# Patient Record
Sex: Male | Born: 1980
Health system: Southern US, Community
[De-identification: ages and names within clinical notes are randomized; demographics above are authoritative.]

## PROBLEM LIST (undated history)

## (undated) DIAGNOSIS — I10 Essential (primary) hypertension: Secondary | ICD-10-CM

## (undated) DIAGNOSIS — F502 Bulimia nervosa, unspecified: Secondary | ICD-10-CM

## (undated) DIAGNOSIS — H0019 Chalazion unspecified eye, unspecified eyelid: Secondary | ICD-10-CM

## (undated) DIAGNOSIS — T7840XA Allergy, unspecified, initial encounter: Secondary | ICD-10-CM

## (undated) DIAGNOSIS — G459 Transient cerebral ischemic attack, unspecified: Secondary | ICD-10-CM

## (undated) DIAGNOSIS — E7800 Pure hypercholesterolemia, unspecified: Secondary | ICD-10-CM

## (undated) DIAGNOSIS — A539 Syphilis, unspecified: Secondary | ICD-10-CM

## (undated) DIAGNOSIS — F410 Panic disorder [episodic paroxysmal anxiety] without agoraphobia: Secondary | ICD-10-CM

## (undated) DIAGNOSIS — F329 Major depressive disorder, single episode, unspecified: Secondary | ICD-10-CM

## (undated) DIAGNOSIS — I639 Cerebral infarction, unspecified: Secondary | ICD-10-CM

## (undated) DIAGNOSIS — K120 Recurrent oral aphthae: Secondary | ICD-10-CM

## (undated) HISTORY — DX: Pure hypercholesterolemia, unspecified: E78.00

## (undated) HISTORY — PX: TONSILLECTOMY: SHX5217

## (undated) HISTORY — DX: Major depressive disorder, single episode, unspecified: F32.9

## (undated) HISTORY — DX: Cerebral infarction, unspecified: I63.9

## (undated) HISTORY — DX: Recurrent oral aphthae: K12.0

## (undated) HISTORY — DX: Syphilis, unspecified: A53.9

## (undated) HISTORY — DX: Chalazion unspecified eye, unspecified eyelid: H00.19

## (undated) HISTORY — DX: Panic disorder (episodic paroxysmal anxiety): F41.0

## (undated) HISTORY — DX: Bulimia nervosa, unspecified: F50.20

## (undated) HISTORY — DX: Transient cerebral ischemic attack, unspecified: G45.9

## (undated) HISTORY — DX: Allergy, unspecified, initial encounter: T78.40XA

## (undated) HISTORY — DX: Bulimia nervosa: F50.2

## (undated) HISTORY — DX: Essential (primary) hypertension: I10

---
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
## 2002-05-10 ENCOUNTER — Emergency Department (HOSPITAL_COMMUNITY): Admission: EM | Admit: 2002-05-10 | Discharge: 2002-05-10 | Payer: Self-pay | Admitting: Emergency Medicine

## 2002-05-10 ENCOUNTER — Encounter: Payer: Self-pay | Admitting: Emergency Medicine

## 2003-03-26 ENCOUNTER — Emergency Department (HOSPITAL_COMMUNITY): Admission: EM | Admit: 2003-03-26 | Discharge: 2003-03-26 | Payer: Self-pay | Admitting: Emergency Medicine

## 2004-04-24 ENCOUNTER — Ambulatory Visit: Payer: Self-pay | Admitting: *Deleted

## 2004-04-24 ENCOUNTER — Ambulatory Visit: Payer: Self-pay | Admitting: Family Medicine

## 2004-09-13 ENCOUNTER — Emergency Department (HOSPITAL_COMMUNITY): Admission: EM | Admit: 2004-09-13 | Discharge: 2004-09-14 | Payer: Self-pay | Admitting: Emergency Medicine

## 2004-09-16 ENCOUNTER — Ambulatory Visit: Payer: Self-pay | Admitting: Nurse Practitioner

## 2004-11-03 ENCOUNTER — Emergency Department (HOSPITAL_COMMUNITY): Admission: EM | Admit: 2004-11-03 | Discharge: 2004-11-03 | Payer: Self-pay | Admitting: Emergency Medicine

## 2005-01-14 ENCOUNTER — Ambulatory Visit: Payer: Self-pay | Admitting: Family Medicine

## 2005-03-17 ENCOUNTER — Ambulatory Visit: Payer: Self-pay | Admitting: Family Medicine

## 2005-03-31 ENCOUNTER — Ambulatory Visit: Payer: Self-pay | Admitting: Family Medicine

## 2005-04-02 ENCOUNTER — Ambulatory Visit: Payer: Self-pay | Admitting: Family Medicine

## 2005-04-19 ENCOUNTER — Ambulatory Visit: Payer: Self-pay | Admitting: Family Medicine

## 2005-05-28 ENCOUNTER — Ambulatory Visit: Payer: Self-pay | Admitting: Family Medicine

## 2005-08-16 ENCOUNTER — Ambulatory Visit: Payer: Self-pay | Admitting: Family Medicine

## 2005-10-18 ENCOUNTER — Emergency Department (HOSPITAL_COMMUNITY): Admission: EM | Admit: 2005-10-18 | Discharge: 2005-10-18 | Payer: Self-pay | Admitting: Emergency Medicine

## 2006-02-26 ENCOUNTER — Emergency Department (HOSPITAL_COMMUNITY): Admission: EM | Admit: 2006-02-26 | Discharge: 2006-02-26 | Payer: Self-pay | Admitting: Emergency Medicine

## 2006-03-17 ENCOUNTER — Emergency Department (HOSPITAL_COMMUNITY): Admission: EM | Admit: 2006-03-17 | Discharge: 2006-03-17 | Payer: Self-pay | Admitting: Emergency Medicine

## 2006-09-29 ENCOUNTER — Ambulatory Visit: Payer: Self-pay | Admitting: Internal Medicine

## 2006-10-05 ENCOUNTER — Encounter (INDEPENDENT_AMBULATORY_CARE_PROVIDER_SITE_OTHER): Payer: Self-pay | Admitting: *Deleted

## 2007-03-23 ENCOUNTER — Ambulatory Visit: Payer: Self-pay | Admitting: Internal Medicine

## 2007-07-04 ENCOUNTER — Ambulatory Visit: Payer: Self-pay | Admitting: Internal Medicine

## 2007-07-04 LAB — CONVERTED CEMR LAB
Chlamydia, Swab/Urine, PCR: NEGATIVE
RPR Titer: 1:2 {titer}

## 2007-07-07 ENCOUNTER — Ambulatory Visit: Payer: Self-pay | Admitting: Internal Medicine

## 2007-07-13 ENCOUNTER — Ambulatory Visit: Payer: Self-pay | Admitting: Internal Medicine

## 2007-07-19 ENCOUNTER — Ambulatory Visit: Payer: Self-pay | Admitting: Internal Medicine

## 2007-12-18 ENCOUNTER — Emergency Department (HOSPITAL_COMMUNITY): Admission: EM | Admit: 2007-12-18 | Discharge: 2007-12-18 | Payer: Self-pay | Admitting: Emergency Medicine

## 2008-03-11 ENCOUNTER — Emergency Department (HOSPITAL_COMMUNITY): Admission: EM | Admit: 2008-03-11 | Discharge: 2008-03-11 | Payer: Self-pay | Admitting: Emergency Medicine

## 2008-09-13 ENCOUNTER — Emergency Department (HOSPITAL_COMMUNITY): Admission: EM | Admit: 2008-09-13 | Discharge: 2008-09-13 | Payer: Self-pay | Admitting: Emergency Medicine

## 2008-11-01 ENCOUNTER — Ambulatory Visit: Payer: Self-pay | Admitting: Family Medicine

## 2008-11-01 DIAGNOSIS — F329 Major depressive disorder, single episode, unspecified: Secondary | ICD-10-CM

## 2008-11-01 DIAGNOSIS — F3289 Other specified depressive episodes: Secondary | ICD-10-CM

## 2008-11-01 DIAGNOSIS — F32A Depression, unspecified: Secondary | ICD-10-CM | POA: Insufficient documentation

## 2008-11-01 DIAGNOSIS — L0100 Impetigo, unspecified: Secondary | ICD-10-CM

## 2008-11-01 HISTORY — DX: Other specified depressive episodes: F32.89

## 2008-11-01 HISTORY — DX: Major depressive disorder, single episode, unspecified: F32.9

## 2008-11-13 ENCOUNTER — Telehealth: Payer: Self-pay | Admitting: Family Medicine

## 2008-11-19 ENCOUNTER — Ambulatory Visit: Payer: Self-pay | Admitting: Family Medicine

## 2008-11-20 LAB — CONVERTED CEMR LAB

## 2009-02-22 ENCOUNTER — Emergency Department (HOSPITAL_COMMUNITY): Admission: EM | Admit: 2009-02-22 | Discharge: 2009-02-22 | Payer: Self-pay | Admitting: Emergency Medicine

## 2009-02-26 ENCOUNTER — Encounter: Admission: RE | Admit: 2009-02-26 | Discharge: 2009-02-26 | Payer: Self-pay | Admitting: Chiropractic Medicine

## 2009-03-26 ENCOUNTER — Ambulatory Visit: Payer: Self-pay | Admitting: Family Medicine

## 2009-03-26 DIAGNOSIS — R300 Dysuria: Secondary | ICD-10-CM | POA: Insufficient documentation

## 2009-04-01 ENCOUNTER — Telehealth: Payer: Self-pay | Admitting: Family Medicine

## 2009-04-01 LAB — CONVERTED CEMR LAB: GC Probe Amp, Urine: NEGATIVE

## 2009-05-12 ENCOUNTER — Ambulatory Visit: Payer: Self-pay | Admitting: Family Medicine

## 2009-05-14 ENCOUNTER — Ambulatory Visit: Payer: Self-pay | Admitting: Family Medicine

## 2009-05-30 ENCOUNTER — Ambulatory Visit: Payer: Self-pay | Admitting: Family Medicine

## 2009-05-30 DIAGNOSIS — K120 Recurrent oral aphthae: Secondary | ICD-10-CM | POA: Insufficient documentation

## 2009-05-30 DIAGNOSIS — H0019 Chalazion unspecified eye, unspecified eyelid: Secondary | ICD-10-CM

## 2009-05-30 HISTORY — DX: Chalazion unspecified eye, unspecified eyelid: H00.19

## 2009-05-30 HISTORY — DX: Recurrent oral aphthae: K12.0

## 2009-06-02 LAB — CONVERTED CEMR LAB

## 2009-07-11 ENCOUNTER — Telehealth: Payer: Self-pay | Admitting: Family Medicine

## 2009-09-29 ENCOUNTER — Telehealth: Payer: Self-pay | Admitting: Family Medicine

## 2009-10-01 ENCOUNTER — Ambulatory Visit: Payer: Self-pay | Admitting: Family Medicine

## 2009-12-30 ENCOUNTER — Ambulatory Visit: Payer: Self-pay | Admitting: Family Medicine

## 2009-12-30 ENCOUNTER — Encounter: Payer: Self-pay | Admitting: Family Medicine

## 2009-12-31 LAB — CONVERTED CEMR LAB: HIV: NONREACTIVE

## 2010-02-08 ENCOUNTER — Encounter: Payer: Self-pay | Admitting: Chiropractic Medicine

## 2010-02-17 NOTE — Assessment & Plan Note (Signed)
Summary: LIP OUTBREAK/NJR   Vital Signs:  Patient profile:   30 year old male Temp:     98.7 degrees F oral BP sitting:   130 / 82  (left arm) Cuff size:   regular  Vitals Entered By: Sid Falcon LPN (May 30, 2009 9:42 AM) CC: Sore inside lip   History of Present Illness: Patient has the following items.  Left upper lip soreness that occurred 2 weeks ago. Initially thought was a cold sore. This is healing. No history of type I herpes.  No sore throat.  No alleviating factors.  Left upper eyelid nodular swelling. Nonpainful. No drainage. He felt this was a sty.  No visual changes.  Patient requests repeat HIV testing. Ongoing risk factors. Previous HIV screens negative. Denies any fever, chills, cough, or any other rashes.  Allergies: 1)  ! 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    Physical Exam  General:  Well-developed,well-nourished,in no acute distress; alert,appropriate and cooperative throughout examination Head:  Normocephalic and  atraumatic without obvious abnormalities. No apparent alopecia or balding. Eyes:  left upper lid reveals nodular swelling mid aspect of lid with no erythema or drainage. No evidence for sty. Pupils equal and reactive to light. Ears:  External ear exam shows no significant lesions or deformities.  Otoscopic examination reveals clear canals, tympanic membranes are intact bilaterally without bulging, retraction, inflammation or discharge. Hearing is grossly normal bilaterally. Mouth:  left upper lip reveals approximately 1 cm aphthous ulcer. No evidence for any cold sores. No thrush. Neck:  No deformities, masses, or tenderness noted. Lungs:  Normal respiratory effort, chest expands symmetrically. Lungs are clear to auscultation, no crackles or wheezes. Heart:  Normal rate and regular rhythm. S1 and S2 normal without gallop, murmur, click, rub or other extra sounds. Skin:  no rashes.   Cervical Nodes:  No lymphadenopathy noted Psych:  normally interactive and good eye contact.     Impression & Recommendations:  Problem # 1:  CHALAZION, LEFT (ICD-373.2) Assessment New warm compresses and observe.  Reassurance given.  Problem # 2:  APHTHOUS ULCERS (ICD-528.2) Assessment: New explained these are usually viral.  Salt water gargles.  Problem # 3:  SCREENING EXAMINATION FOR VENEREAL DISEASE (ICD-V74.5) discussed prevention measures.  Repeat HIV screen Orders: T-HIV Antibody  (Reflex) ([id number]) Venipuncture (09811)  Patient Instructions: 1)  use warm compresses to left eye several times daily 2)  Gargle with salt water for mouth ulcer

## 2010-02-17 NOTE — Progress Notes (Signed)
Summary: Valtrex  Phone Note Call from Patient   Caller: Patient Call For: Edwin Peat MD Reason for Call: Talk to Nurse Summary of Call: Pt left a message for a call back from nurse.  No voice mail. Initial call taken by: Lynann Beaver CMA,  September 29, 2009 2:11 PM  Follow-up for Phone Call        Pt wants a refill on Valtrex for HSV flare. [facility])  Follow-up by: Lynann Beaver CMA,  September 30, 2009 9:46 AM  Additional Follow-up for Phone Call Additional follow up Details #1::        OK to refill VALTREX 500 mg one by mouth two times a day for 3 days as needed HSV flare disp #30 with one refill Additional Follow-up by: Edwin Peat MD,  September 30, 2009 12:38 PM    New/Updated Medications: VALTREX 500 MG TABS (VALACYCLOVIR HCL) one by mouth two times a day x 3 days for herpes outbreak. Prescriptions: VALTREX 500 MG TABS (VALACYCLOVIR HCL) one by mouth two times a day x 3 days for herpes outbreak.  #30 x 0   Entered by:   Lynann Beaver CMA   Authorized by:   Edwin Peat MD   Signed by:   Lynann Beaver CMA on 09/30/2009   Method used:   Electronically to        [facility] [address] [phone]* (retail)       [address]       [address]       [place], [place]  [address]       Ph: [phone]       Fax: [phone]   RxID:   [id number]

## 2010-02-17 NOTE — Letter (Signed)
Summary: TB READING  TB READING   Imported By: Lysle Rubens 05/14/2009 16:28:23  _____________________________________________________________________  External Attachment:    Type:   Image     Comment:   External Document

## 2010-02-17 NOTE — Assessment & Plan Note (Signed)
Summary: TB READ/NS/CB   Nurse Visit   Allergies: 1)  ! Cephalexin (Cephalexin)  PPD Results    Date of reading: 05/14/2009    Results: < 5mm    Interpretation: negative

## 2010-02-17 NOTE — Assessment & Plan Note (Signed)
Summary: form completion for foster care/njr   Vital Signs:  Patient profile:   30 year old male Height:      65 inches Weight:      154 pounds BMI:     25.72 Temp:     98.5 degrees F oral Pulse rate:   72 / minute Resp:     14 per minute BP sitting:   120 / 80  (left arm)  Vitals Entered By: Willy Eddy, LPN (May 12, 2009 10:36 AM) CC: form completion   History of Present Illness: Patient here for wellness exam. Needs form completed for foster parenting.  Actually lives with his mother who is a foster parent. He does not have any significant chronic medical problems. No history of communicable diseases other than remote hx syphilis which was treated. No history of tuberculosis or known risk factors.  Immunizations are up to date.  Preventive Screening-Counseling & Management  Alcohol-Tobacco     Smoking Status: never  Current Problems (verified): 1)  Dysuria  (ICD-788.1) 2)  Screening Examination For Venereal Disease  (ICD-V74.5) 3)  Bullous Impetigo  (ICD-684) 4)  Family History Diabetes 1st Degree Relative  (ICD-V18.0) 5)  Family History of Colon Ca 1st Degree Relative <60  (ICD-V16.0) 6)  Family History Breast Cancer 1st Degree Relative <50  (ICD-V16.3) 7)  Family History of Alcoholism/addiction  (ICD-V61.41) 8)  Depression  (ICD-311)  Current Medications (verified): 1)  None  Allergies (verified): 1)  ! 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 Alcohol use-yes Regular exercise-yes  Risk Factors: Exercise: yes (11/01/2008)  Risk Factors: Smoking Status: never (05/12/2009)  Review of Systems  The patient denies anorexia, fever, weight loss, weight gain, vision loss, decreased hearing, hoarseness, chest pain, syncope, dyspnea on exertion, peripheral edema, prolonged cough, headaches, hemoptysis, abdominal pain, melena, hematochezia, severe indigestion/heartburn, hematuria, incontinence, genital sores, muscle weakness, suspicious skin lesions, transient blindness, difficulty walking, depression, unusual weight change, abnormal bleeding, enlarged lymph nodes, and testicular masses.         complains of some fatigue otherwise no specific complaints  Physical Exam  General:  Well-developed,well-nourished,in no acute distress; alert,appropriate and cooperative throughout examination Head:  Normocephalic and atraumatic without obvious abnormalities. No apparent alopecia or balding. Eyes:  No corneal or conjunctival inflammation noted. EOMI. Perrla. Funduscopic exam benign, without hemorrhages, exudates or papilledema. Vision grossly normal. Ears:  External ear exam shows no significant lesions or deformities.  Otoscopic examination reveals clear canals, tympanic membranes are intact bilaterally without bulging, retraction, inflammation or discharge. Hearing is grossly normal bilaterally. Mouth:  Oral mucosa and oropharynx without lesions or exudates.  Teeth in good repair. Neck:  No deformities, masses, or tenderness noted. Lungs:  Normal respiratory effort, chest expands symmetrically. Lungs are clear to auscultation, no crackles or wheezes. Heart:  Normal rate and regular rhythm. S1 and S2 normal without gallop, murmur, click, rub or other extra sounds. Abdomen:  Bowel sounds positive,abdomen soft and non-tender without masses, organomegaly or hernias noted. Genitalia:   reason exam normal Msk:  No  deformity or scoliosis noted of thoracic or lumbar spine.   Extremities:  No clubbing, cyanosis, edema, or deformity noted with normal full range of motion of all joints.   Neurologic:  alert & oriented X3, cranial nerves II-XII intact, and strength normal in all extremities.   Skin:  Intact without suspicious lesions or rashes Psych:  normally interactive, good eye contact, not anxious appearing, and not depressed appearing.     Impression & Recommendations:  Problem # 1:  Preventive Health Care (ICD-V70.0) patient states last tetanus 2007. Consider followup for screening lab work.  PPD placed to be read in 2-3 days  Other Orders: TB Skin Test [id number]) Admin 1st Vaccine (60454)  Patient Instructions: 1)  return in 2-3 days to have PPD read   Immunization History:  Tetanus/Td Immunization History:    Tetanus/Td:  historical (01/18/2005)  Immunizations Administered:  PPD Skin Test:    Vaccine Type: PPD    Site: left forearm    Mfr: Sanofi Pasteur    Dose: 0.1 ml    Route: ID    Given by: Willy Eddy, LPN    Exp. Date: 10/31/2010    Lot #: [id number]    Orders Added: 1)  TB Skin Test [86580] 2)  Admin 1st Vaccine [90471] 3)  Est. Patient 18-39 years [47829]

## 2010-02-17 NOTE — Progress Notes (Signed)
Summary: lab results  Phone Note Call from Patient   Caller: Patient Call For: Evelena Peat MD Reason for Call: Lab or Test Results Action Taken: Provider Notified Summary of Call: pt is calling for lab results, please. [phone] Initial call taken by: Lynann Beaver CMA,  April 01, 2009 11:47 AM  Follow-up for Phone Call        results given, updated phone numbers Follow-up by: Sid Falcon LPN,  April 01, 2009 12:25 PM

## 2010-02-17 NOTE — Assessment & Plan Note (Signed)
Summary: CONSULT RE: STD/CJR   Vital Signs:  Patient profile:   30 year old male Weight:      153 pounds Temp:     98.2 degrees F oral BP sitting:   120 / 84  (left arm) Cuff size:   regular  Vitals Entered By: Sid Falcon LPN (October 01, 2009 8:37 AM) CC: consultation   History of Present Illness: Patient here to discuss STD issues.  He had unprotected intercourse last week. He states his only second time in several months for unprotected intercourse. Previous HIV screening negative. Does have some mild burning with urination but no fever or chills. No purulent discharge. He'd like to be tested for chlamydia and GC. Has taken Zithromax previously without side effect. Denies any new rashes.  Allergies: 1)  ! Cephalexin (Cephalexin)  Past History:  Past Medical History: Last updated: 11/01/2008 Depression Eating disorder Syphilis Panic attacks PMH reviewed for relevance  Physical Exam  General:  Well-developed,well-nourished,in no acute distress; alert,appropriate and cooperative throughout examination Mouth:  Oral mucosa and oropharynx without lesions or exudates.  Teeth in good repair. Lungs:  Normal respiratory effort, chest expands symmetrically. Lungs are clear to auscultation, no crackles or wheezes. Heart:  Normal rate and regular rhythm. S1 and S2 normal without gallop, murmur, click, rub or other extra sounds.   Impression & Recommendations:  Problem # 1:  SCREENING EXAMINATION FOR VENEREAL DISEASE (ICD-V74.5) obtain urine GC and chlamydia probe. Cover with azithromycin 500 mg 2 tablets x1 dose pending results. Discussed HPV vaccine and he'll consider  Complete Medication List: 1)  Triamcinolone in Absorbase 0.05 % Oint (Triamcinolone acetonide) .... Apply 3-4 times daily to affected areas in mouth 2)  Valtrex 500 Mg Tabs (Valacyclovir hcl) .... One by mouth two times a day x 3 days for herpes outbreak. 3)  Azithromycin 500 Mg Tabs (Azithromycin) .... 2  tabs by mouth times one dose  Other Orders: T-Chlamydia  Probe, urine [id number]) T-GC Probe, urine [id number]) Admin 1st Vaccine (29562) Flu Vaccine 83yrs + (13086)  Patient Instructions: 1)  If you could be exposed to sexually transmitted diseases. you should use a condom.  Prescriptions: AZITHROMYCIN 500 MG TABS (AZITHROMYCIN) 2 tabs by mouth times one dose  #2 x 0   Entered and Authorized by:   Evelena Peat MD   Signed by:   Evelena Peat MD on 10/01/2009   Method used:   Electronically to        [facility] [address] [phone]* (retail)       [address]       [address]       [place], [place]  [address]       Ph: [phone]       Fax: [phone]   RxID:   [id number]      Flu Vaccine Consent Questions     Do you have a history of severe allergic reactions to this vaccine? no    Any prior history of allergic reactions to egg and/or gelatin? no    Do you have a sensitivity to the preservative Thimersol? no    Do you have a past history of Guillan-Barre Syndrome? no    Do you currently have an acute febrile illness? no    Have you ever had a severe reaction to latex? no    Vaccine information given and explained to patient? yes    Are you currently pregnant? no    Lot Number:[id number]   Exp Date:07/18/2010  Site Given  Left Deltoid IMlu

## 2010-02-17 NOTE — Progress Notes (Signed)
Summary: REQ FOR RX mouth ulcers  Phone Note Call from Patient   Caller: Patient    [phone] Summary of Call: Pt called to speak with Harriett Sine, LPN.... Pt adv that he was told at last OV that if he had another outbreak of ulcers (mouth/lip area) to call and Rx would be sent in for sxs?..... Pt req Rx be sent to [facility]...Marland KitchenMarland Kitchen Pt can be reached at [phone].  Initial call taken by: Debbra Riding,  July 11, 2009 4:22 PM  Follow-up for Phone Call        OK to call in Kenalog in orabase use three times a day to QID as needed mouth ulcers. Follow-up by: Evelena Peat MD,  July 11, 2009 5:42 PM  Additional Follow-up for Phone Call Additional follow up Details #1::        Pt inforned Additional Follow-up by: Sid Falcon LPN,  July 11, 2009 5:50 PM    New/Updated Medications: TRIAMCINOLONE IN ABSORBASE 0.05 % OINT (TRIAMCINOLONE ACETONIDE) apply 3-4 times daily to affected areas in mouth Prescriptions: TRIAMCINOLONE IN ABSORBASE 0.05 % OINT (TRIAMCINOLONE ACETONIDE) apply 3-4 times daily to affected areas in mouth  #2 x 1   Entered by:   Sid Falcon LPN   Authorized by:   Evelena Peat MD   Signed by:   Sid Falcon LPN on 29/56/2130   Method used:   Electronically to        [facility] [address] [phone]* (retail)       [address]       [address]       [place], [place]  [address]       Ph: [phone]       Fax: [phone]   RxID:   [id number]

## 2010-02-17 NOTE — Assessment & Plan Note (Signed)
Summary: PERSONAL REASON // RS   Vital Signs:  Patient profile:   30 year old male Weight:      150 pounds Temp:     99.0 degrees F oral BP sitting:   120 / 78  (left arm) Cuff size:   regular  Vitals Entered By: Sid Falcon LPN (March 26, 1608 11:14 AM) CC: personal   History of Present Illness: Acute visit. Patient seen with some mild burning with urination for the past couple of weeks or so. Denies any fever or chills. No penile rash. No urethral discharge. Similar symptoms with Chlamydia which he had almost one year ago. Patient does have risk factors for STD. Previously treated with azithromycin without difficulty. Has had prior hepatitis B vaccine. Request HIV testing. Tested in November and negative. No recent appetite or weight changes.  Allergies: 1)  ! Cephalexin (Cephalexin)  Past History:  Past Medical History: Last updated: 11/01/2008 Depression Eating disorder Syphilis Panic attacks PMH reviewed for relevance  Review of Systems  The patient denies anorexia, fever, weight loss, hematuria, incontinence, and genital sores.    Physical Exam  General:  Well-developed,well-nourished,in no acute distress; alert,appropriate and cooperative throughout examination Genitalia:  Testes bilaterally descended without nodularity, tenderness or masses. No scrotal masses or lesions. No penis lesions or urethral discharge. Skin:  no rash   Impression & Recommendations:  Problem # 1:  SCREENING EXAMINATION FOR VENEREAL DISEASE (ICD-V74.5) discussed STD prevention with barrier protection. Orders: T-HIV Antibody  (Reflex) [id number]) T-Chlamydia  Probe, urine [id number]) T-GC Probe, urine ([id number]) Venipuncture (69629)  Problem # 2:  DYSURIA (ICD-788.1) Assessment: New  rule out chlamydia.  Give 1gm dose Zithromax pending cx results.  Orders: UA Dipstick w/o Micro (manual) (52841) Venipuncture (32440)  His updated medication list for this problem  includes:    Azithromycin 500 Mg Tabs (Azithromycin) .Marland Kitchen... 2 tablets by mouth times one dose.  Complete Medication List: 1)  Bactroban 2 % Oint (Mupirocin) .... Apply to affected rash bid 2)  Valtrex 500 Mg Tabs (Valacyclovir hcl) .... One by mouth two times a day for 3 days as needed recurrent infection 3)  Azithromycin 500 Mg Tabs (Azithromycin) .... 2 tablets by mouth times one dose.  Patient Instructions: 1)  If you could be exposed to sexually transmitted diseases. you should use a condom.  Prescriptions: AZITHROMYCIN 500 MG TABS (AZITHROMYCIN) 2 tablets by mouth times one dose.  #2 x 0   Entered and Authorized by:   Evelena Peat MD   Signed by:   Evelena Peat MD on 03/26/2009   Method used:   Electronically to        [facility]* (retail)       [address]       [place], [place]  [id number]       Ph: [phone]       Fax: [phone]   RxID:   [id number]   Appended Document: PERSONAL REASON // RS LMTCB  Appended Document: PERSONAL REASON // RS Pt called back, results given

## 2010-02-19 NOTE — Assessment & Plan Note (Signed)
Summary: COUGH // RS   Vital Signs:  Patient profile:   30 year old male Weight:      163 pounds Temp:     98.6 degrees F oral BP sitting:   130 / 80  (left arm) Cuff size:   regular  Vitals Entered By: Sid Falcon LPN (December 30, 2009 9:34 AM)  History of Present Illness: Patient seen with acute upper respiratory illness. This started about 10 days ago. Initial sore throat for 2 or 3 days followed by nasal congestion and now mostly nonproductive cough at night. Cough not relieved with Mucinex. No associated fever. No facial pain. Some chest wall soreness related to coughing.  Patient requests repeat HIV test. These have been negative in the past. No other concerning symptoms  Allergies: 1)  ! Cephalexin (Cephalexin)  Past History:  Past Medical History: Last updated: 11/01/2008 Depression Eating disorder Syphilis Panic attacks  Physical Exam  General:  Well-developed,well-nourished,in no acute distress; alert,appropriate and cooperative throughout examination Ears:  External ear exam shows no significant lesions or deformities.  Otoscopic examination reveals clear canals, tympanic membranes are intact bilaterally without bulging, retraction, inflammation or discharge. Hearing is grossly normal bilaterally. Mouth:  Oral mucosa and oropharynx without lesions or exudates.  Teeth in good repair. Neck:  No deformities, masses, or tenderness noted. Lungs:  Normal respiratory effort, chest expands symmetrically. Lungs are clear to auscultation, no crackles or wheezes. Heart:  Normal rate and regular rhythm. S1 and S2 normal without gallop, murmur, click, rub or other extra sounds.   Impression & Recommendations:  Problem # 1:  ACUTE BRONCHITIS (ICD-466.0) suspect viral. Cough med prescribed. The following medications were removed from the medication list:    Azithromycin 500 Mg Tabs (Azithromycin) .Marland Kitchen... 2 tabs by mouth times one dose His updated medication list for this  problem includes:    Hydrocodone-homatropine 5-1.5 Mg/40ml Syrp (Hydrocodone-homatropine) ..... One tsp by mouth q 4-6 hours as needed cough  Problem # 2:  SCREENING EXAMINATION FOR VENEREAL DISEASE (ICD-V74.5)  Orders: T-HIV Antibody  (Reflex) ([id number]) Specimen Handling (14782) Venipuncture (95621)  Complete Medication List: 1)  Triamcinolone in Absorbase 0.05 % Oint (Triamcinolone acetonide) .... Apply 3-4 times daily to affected areas in mouth 2)  Valtrex 500 Mg Tabs (Valacyclovir hcl) .... One by mouth two times a day x 3 days for herpes outbreak. 3)  Hydrocodone-homatropine 5-1.5 Mg/63ml Syrp (Hydrocodone-homatropine) .... One tsp by mouth q 4-6 hours as needed cough  Patient Instructions: 1)  Acute Bronchitis symptoms for less then 10 days are not  helped by antibiotics. Take over the counter cough medications. Call if no improvement in 5-7 days, sooner if increasing cough, fever, or new symptoms ( shortness of breath, chest pain) .  Prescriptions: HYDROCODONE-HOMATROPINE 5-1.5 MG/5ML SYRP (HYDROCODONE-HOMATROPINE) one tsp by mouth q 4-6 hours as needed cough  #120 ml x 0   Entered and Authorized by:   Evelena Peat MD   Signed by:   Evelena Peat MD on 12/30/2009   Method used:   Print then Give to Patient   RxID:   [id number]    Orders Added: 1)  T-HIV Antibody  (Reflex) [[id number]] 2)  Est. Patient Level III [72536] 3)  Specimen Handling [99000] 4)  Venipuncture [64403]

## 2010-05-15 ENCOUNTER — Encounter: Payer: Self-pay | Admitting: Family Medicine

## 2010-05-18 ENCOUNTER — Ambulatory Visit: Payer: Self-pay | Admitting: Family Medicine

## 2010-07-23 ENCOUNTER — Encounter: Payer: Self-pay | Admitting: Family Medicine

## 2010-07-24 ENCOUNTER — Ambulatory Visit (INDEPENDENT_AMBULATORY_CARE_PROVIDER_SITE_OTHER): Payer: Managed Care, Other (non HMO) | Admitting: Family Medicine

## 2010-07-24 ENCOUNTER — Encounter: Payer: Self-pay | Admitting: Family Medicine

## 2010-07-24 VITALS — BP 120/78 | Temp 98.0°F | Ht 65.5 in | Wt 168.0 lb

## 2010-07-24 DIAGNOSIS — M545 Low back pain: Secondary | ICD-10-CM

## 2010-07-24 DIAGNOSIS — Z113 Encounter for screening for infections with a predominantly sexual mode of transmission: Secondary | ICD-10-CM

## 2010-07-24 MED ORDER — CYCLOBENZAPRINE HCL 10 MG PO TABS: 10.0000 mg | ORAL_TABLET | Freq: Three times a day (TID) | ORAL | Status: AC | PRN

## 2010-07-24 MED ORDER — NAPROXEN 500 MG PO TABS: 500.0000 mg | ORAL_TABLET | Freq: Two times a day (BID) | ORAL | Status: DC

## 2010-07-24 NOTE — Progress Notes (Signed)
  Subjective:    Patient ID: Edwin Garcia, male    DOB: 12-03-1980, 30 y.o.   MRN: [id number]  HPI Pt seen with low back pain. 2 days ago stepped in a pothole. Onset back pain then. Somewhat poorly localized lower lumbar area. No radiculopathy symptoms. Pain rated 10 out of 10 at times. Lying supine helps somewhat. Pain with change of position also with prolonged sitting or standing. He took some old hydrocodone with minimal relief. Sensation of muscle spasm also noted. No real weakness or numbness.  Patient also requests repeat HIV testing. No history of any recent STD   Review of Systems  Constitutional: Negative for fever, activity change and appetite change.  Respiratory: Negative for cough and shortness of breath.   Cardiovascular: Negative for chest pain and leg swelling.  Gastrointestinal: Negative for vomiting and abdominal pain.  Genitourinary: Negative for dysuria, hematuria and flank pain.  Musculoskeletal: Positive for back pain. Negative for joint swelling.  Neurological: Negative for weakness and numbness.       Objective:   Physical Exam  Constitutional: He appears well-developed and well-nourished.  Neck: Neck supple. No thyromegaly present.  Cardiovascular: Normal rate, regular rhythm and normal heart sounds.   Pulmonary/Chest: Effort normal and breath sounds normal. No respiratory distress. He has no wheezes. He has no rales.  Musculoskeletal: He exhibits no edema.       SLRs negative.  Lymphadenopathy:    He has no cervical adenopathy.  Neurological:       Full strength lower extremities.  Normal reflexes.  Normal sensory.          Assessment & Plan:  Low-back pain.  Naproxen 500 mg twice daily with food. Flexeril 10 mg every 8 hours as needed with caution for sedation. Walking as tolerated.  Follow up in 2 weeks if no better

## 2010-07-24 NOTE — Patient Instructions (Signed)
Heat or ice for symptom relief. Touch base in 2 weeks if no better.

## 2010-07-28 NOTE — Progress Notes (Signed)
Quick Note:  Pt informed on VM ______ 

## 2010-10-27 ENCOUNTER — Ambulatory Visit (INDEPENDENT_AMBULATORY_CARE_PROVIDER_SITE_OTHER): Payer: Managed Care, Other (non HMO) | Admitting: Family Medicine

## 2010-10-27 ENCOUNTER — Encounter: Payer: Self-pay | Admitting: Family Medicine

## 2010-10-27 VITALS — BP 130/80 | Temp 98.0°F | Wt 168.0 lb

## 2010-10-27 DIAGNOSIS — Z113 Encounter for screening for infections with a predominantly sexual mode of transmission: Secondary | ICD-10-CM

## 2010-10-27 DIAGNOSIS — F4310 Post-traumatic stress disorder, unspecified: Secondary | ICD-10-CM

## 2010-10-27 DIAGNOSIS — Z23 Encounter for immunization: Secondary | ICD-10-CM

## 2010-10-27 MED ORDER — AZITHROMYCIN 500 MG PO TABS: ORAL_TABLET | ORAL | Status: DC
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
## 2010-10-28 LAB — GC/CHLAMYDIA PROBE AMP, URINE
Chlamydia, Swab/Urine, PCR: NEGATIVE
GC Probe Amp, Urine: NEGATIVE

## 2010-10-28 NOTE — Progress Notes (Signed)
Quick Note:  Pt informed on VM ______ 

## 2010-11-11 ENCOUNTER — Telehealth: Payer: Self-pay | Admitting: Family Medicine

## 2010-11-11 NOTE — Telephone Encounter (Signed)
Pt was seen on 10-27-2010 never received med for ?STD. Please call [facility] [phone]

## 2010-11-11 NOTE — Telephone Encounter (Signed)
No STD by screening tests so no indication for antibiotic.

## 2010-11-12 ENCOUNTER — Telehealth: Payer: Self-pay | Admitting: *Deleted

## 2010-11-12 ENCOUNTER — Telehealth: Payer: Self-pay | Admitting: Family Medicine

## 2010-11-12 MED ORDER — AZITHROMYCIN 500 MG PO TABS: ORAL_TABLET | ORAL | Status: DC

## 2010-11-12 NOTE — Telephone Encounter (Signed)
I received verbal OK to refill Zithromax, #2 pills.  Pt informed

## 2010-11-12 NOTE — Telephone Encounter (Signed)
Pt was informed on VM that antibiotic was not indicated at this time due to lab results.  10 min later pt called back to report he is "itiching again, foul odor and discharge, I know when I have an issue from experience.".  The 10/9 Rx was never filled, so he went to his pharmacy and thought he was going to have that filled, instead he got flexeril and naproxen, which he does not need.

## 2010-11-12 NOTE — Telephone Encounter (Signed)
Per phone note from Dr Caryl Never, No STD noted at last lab check, therefore no antibiotic is indicated at this time.  Pt informed on VM

## 2010-11-12 NOTE — Telephone Encounter (Signed)
Pt informed on VM

## 2010-11-12 NOTE — Telephone Encounter (Signed)
Pt went  to pick up rx for   azithromycin (ZITHROMAX) 500 MG tablet    But it was not at pharmacy. Pt requesting it be resubmitted to [facility]

## 2010-11-26 ENCOUNTER — Encounter: Payer: Self-pay | Admitting: Family Medicine

## 2010-11-26 ENCOUNTER — Ambulatory Visit (INDEPENDENT_AMBULATORY_CARE_PROVIDER_SITE_OTHER): Payer: Managed Care, Other (non HMO) | Admitting: Family Medicine

## 2010-11-26 VITALS — BP 130/80 | Temp 98.6°F | Wt 168.0 lb

## 2010-11-26 DIAGNOSIS — R369 Urethral discharge, unspecified: Secondary | ICD-10-CM

## 2010-11-26 NOTE — Progress Notes (Signed)
  Subjective:    Patient ID: Edwin Garcia, male    DOB: 1980/05/20, 30 y.o.   MRN: [id number]  HPI  Patient here requesting STD screening. Recently screened for HIV, RPR, and Chlamydia and gonorrhea. All negative. No sexual contact since then. 2 weeks ago developed clear penile discharge. Concern for possible chlamydia. He had some leftover antibiotic which was Zithromax and symptoms are better but not fully resolved at this time. Also has history of genital herpes and syphilis which was treated. No recent rashes. No fever or chills. No burning with urination. No appetite or weight changes. Does not use barrier protection consistently. He is concerned he may be developing a sexual addiction. We had referred for counseling previously but he has not set up yet   Review of Systems  Constitutional: Negative for fever, chills, appetite change and unexpected weight change.  Gastrointestinal: Negative for abdominal pain.  Genitourinary: Positive for dysuria.       Objective:   Physical Exam  Constitutional: He appears well-developed and well-nourished.  Cardiovascular: Normal rate, regular rhythm and normal heart sounds.   Genitourinary: Penis normal.  Skin: No rash noted.          Assessment & Plan:  Urethral discharge. Rule out chlamydia or gonorrhea. Urine test for gonorrhea and chlamydia screen. Discussed safe sex practices. Handout given. Referral for counseling regarding addictive behaviors.

## 2010-11-26 NOTE — Patient Instructions (Signed)
Sexually Transmitted Disease Sexually transmitted disease (STD) refers to any infection that is passed from person to person during sexual activity. This may happen by way of saliva, semen, blood, vaginal mucus, or urine. Common STDs include:  Gonorrhea.   Chlamydia.   Syphilis.   HIV/AIDS.   Genital herpes.   Hepatitis B and C.   Trichomonas.   Human papillomavirus (HPV).   Pubic lice.  CAUSES  An STD may be spread by bacteria, virus, or parasite. A person can get an STD by:  Sexual intercourse with an infected person.   Sharing sex toys with an infected person.   Sharing needles with an infected person.   Having intimate contact with the genitals, mouth, or rectal areas of an infected person.  SYMPTOMS  Some people may not have any symptoms, but they can still pass the infection to others. Different STDs have different symptoms. Symptoms include:  Painful or bloody urination.   Pain in the pelvis, abdomen, vagina, anus, throat, or eyes.   Skin rash, itching, irritation, growths, or sores (lesions). These usually occur in the genital or anal area.   Abnormal vaginal discharge.   Penile discharge in men.   Soft, flesh-colored skin growths in the genital or anal area.   Fever.   Pain or bleeding during sexual intercourse.   Swollen glands in the groin area.   Yellow skin and eyes (jaundice). This is seen with hepatitis.  DIAGNOSIS  To make a diagnosis, your caregiver may:  Take a medical history.   Perform a physical exam.   Take a specimen (culture) to be examined.   Examine a sample of discharge under a microscope.   Perform blood tests.   Perform a Pap test, if this applies.   Perform a colposcopy.   Perform a laparoscopy.  TREATMENT   Chlamydia, gonorrhea, trichomonas, and syphilis can be cured with antibiotic medicine.   Genital herpes, hepatitis, and HIV can be treated, but not cured, with prescribed medicines. The medicines will lessen  the symptoms.   Genital warts from HPV can be treated with medicine or by freezing, burning (electrocautery), or surgery. Warts may come back.   HPV is a virus and cannot be cured with medicine or surgery.However, abnormal areas may be followed very closely by your caregiver and may be removed from the cervix, vagina, or vulva through office procedures or surgery.  If your diagnosis is confirmed, your recent sexual partners need treatment. This is true even if they are symptom-free or have a negative culture or evaluation. They should not have sex until their caregiver says it is okay. HOME CARE INSTRUCTIONS  All sexual partners should be informed, tested, and treated for all STDs.   Take your antibiotics as directed. Finish them even if you start to feel better.   Only take over-the-counter or prescription medicines for pain, discomfort, or fever as directed by your caregiver.   Rest.   Eat a balanced diet and drink enough fluids to keep your urine clear or pale yellow.   Do not have sex until treatment is completed and you have followed up with your caregiver. STDs should be checked after treatment.   Keep all follow-up appointments, Pap tests, and blood tests as directed by your caregiver.   Only use latex condoms and water-soluble lubricants during sexual activity. Do not use petroleum jelly or oils.   Avoid alcohol and illegal drugs.   Get vaccinated for HPV and hepatitis. If you have not received these vaccines   in the past, talk to your caregiver about whether one or both might be right for you.   Avoid risky sex practices that can break the skin.  The only way to avoid getting an STD is to avoid all sexual activity.Latex condoms and dental dams (for oral sex) will help lessen the risk of getting an STD, but will not completely eliminate the risk. SEEK MEDICAL CARE IF:   You have a fever.   You have any new or worsening symptoms.  Document Released: 03/27/2002 Document  Revised: 09/16/2010 Document Reviewed: 04/03/2010 [employer] Patient Information 2012 [employer]. 

## 2010-11-27 ENCOUNTER — Other Ambulatory Visit: Payer: Self-pay | Admitting: *Deleted

## 2010-11-27 LAB — GC/CHLAMYDIA PROBE AMP, GENITAL: Chlamydia, DNA Probe: NEGATIVE

## 2010-11-27 MED ORDER — VALACYCLOVIR HCL 500 MG PO TABS: 500.0000 mg | ORAL_TABLET | Freq: Two times a day (BID) | ORAL | Status: DC

## 2010-11-27 NOTE — Progress Notes (Signed)
Quick Note:  Pt informed, pt questioning if he could be checked for syphilis? ______

## 2010-11-27 NOTE — Progress Notes (Signed)
Quick Note:  Pt informed ______ 

## 2010-12-08 ENCOUNTER — Ambulatory Visit (INDEPENDENT_AMBULATORY_CARE_PROVIDER_SITE_OTHER): Payer: Managed Care, Other (non HMO) | Admitting: Family Medicine

## 2010-12-08 ENCOUNTER — Encounter: Payer: Self-pay | Admitting: Family Medicine

## 2010-12-08 VITALS — BP 124/70 | Temp 98.6°F

## 2010-12-08 DIAGNOSIS — R310 Gross hematuria: Secondary | ICD-10-CM

## 2010-12-08 LAB — POCT URINALYSIS DIPSTICK
Bilirubin, UA: NEGATIVE
Blood, UA: NEGATIVE
Glucose, UA: NEGATIVE
Ketones, UA: NEGATIVE
Protein, UA: NEGATIVE
Urobilinogen, UA: 0.2
pH, UA: 8

## 2010-12-08 MED ORDER — TRIAMCINOLONE ACETONIDE 0.5 % EX OINT: 1.0000 "application " | TOPICAL_OINTMENT | Freq: Two times a day (BID) | CUTANEOUS | Status: DC

## 2010-12-08 NOTE — Progress Notes (Signed)
  Subjective:    Patient ID: Edwin Garcia, male    DOB: May 05, 1980, 30 y.o.   MRN: [id number]  HPI  Patient seen with gross hematuria. Noted this morning with urination. Also some burning with urination. Denies any back pain, nausea, vomiting, fever, or chills. No abdominal pain. No history of kidney stones. History of STDs but had recent screening for chlamydia and GC which was negative. No appetite or weight changes.  Hematuria has cleared through the day.  Past Medical History  Diagnosis Date  . APHTHOUS ULCERS 05/30/2009  . CHALAZION, LEFT 05/30/2009  . DEPRESSION 11/01/2008  . Eating disorder   . Panic attack   . Syphilis    Past Surgical History  Procedure Date  . Tonsillectomy   . Ankle surgery     reports that he has never smoked. He does not have any smokeless tobacco history on file. He reports that he drinks alcohol. He reports that he does not use illicit drugs. family history is negative for Alcohol abuse, and Arthritis, and Cancer, and Depression, and Hypertension, . Allergies  Allergen Reactions  . Cephalexin     REACTION: headache, nausea      Review of Systems  Constitutional: Negative for fever, chills, appetite change and unexpected weight change.  Gastrointestinal: Negative for abdominal pain.  Genitourinary: Positive for dysuria and hematuria. Negative for urgency, frequency, flank pain, decreased urine volume, discharge, scrotal swelling, genital sores, penile pain and testicular pain.  Musculoskeletal: Negative for back pain.       Objective:   Physical Exam  Constitutional: He appears well-developed and well-nourished.  Cardiovascular: Normal rate and regular rhythm.   Pulmonary/Chest: Effort normal and breath sounds normal. No respiratory distress. He has no wheezes. He has no rales.  Abdominal: Soft. He exhibits no distension and no mass. There is no tenderness. There is no rebound and no guarding.  Musculoskeletal: He exhibits no edema.    Skin: No rash noted.          Assessment & Plan:  Gross hematuria by description. Urine dipstick today shows only trace blood with no leukocytes or nitrites. Infectious etiology very unlikely. We discussed other differential including things like kidney stone but he did not have any typical associated pain. We've recommended followup urine in 2 weeks to make sure this is cleared. Followup sooner if any recurrent gross hematuria

## 2010-12-10 ENCOUNTER — Encounter (HOSPITAL_COMMUNITY): Payer: Self-pay | Admitting: *Deleted

## 2010-12-10 DIAGNOSIS — Z79899 Other long term (current) drug therapy: Secondary | ICD-10-CM | POA: Insufficient documentation

## 2010-12-10 DIAGNOSIS — F3289 Other specified depressive episodes: Secondary | ICD-10-CM | POA: Insufficient documentation

## 2010-12-10 DIAGNOSIS — S90569A Insect bite (nonvenomous), unspecified ankle, initial encounter: Secondary | ICD-10-CM | POA: Insufficient documentation

## 2010-12-10 DIAGNOSIS — W57XXXA Bitten or stung by nonvenomous insect and other nonvenomous arthropods, initial encounter: Secondary | ICD-10-CM | POA: Insufficient documentation

## 2010-12-10 DIAGNOSIS — F329 Major depressive disorder, single episode, unspecified: Secondary | ICD-10-CM | POA: Insufficient documentation

## 2010-12-10 DIAGNOSIS — L97809 Non-pressure chronic ulcer of other part of unspecified lower leg with unspecified severity: Secondary | ICD-10-CM | POA: Insufficient documentation

## 2010-12-10 DIAGNOSIS — F410 Panic disorder [episodic paroxysmal anxiety] without agoraphobia: Secondary | ICD-10-CM | POA: Insufficient documentation

## 2010-12-10 NOTE — ED Notes (Signed)
Pt in c/o possible bite to left lower leg, small mark noted with mild swelling

## 2010-12-11 ENCOUNTER — Emergency Department (HOSPITAL_COMMUNITY)
Admission: EM | Admit: 2010-12-11 | Discharge: 2010-12-11 | Disposition: A | Discharge status: Home / Self Care | Payer: Managed Care, Other (non HMO) | Attending: Emergency Medicine | Admitting: Emergency Medicine

## 2010-12-11 ENCOUNTER — Encounter (HOSPITAL_COMMUNITY): Payer: Self-pay | Admitting: Emergency Medicine

## 2010-12-11 DIAGNOSIS — L98499 Non-pressure chronic ulcer of skin of other sites with unspecified severity: Secondary | ICD-10-CM

## 2010-12-11 NOTE — ED Provider Notes (Signed)
History     CSN: [id number] Arrival date & time: 12/11/2010 12:45 AM   First MD Initiated Contact with Patient 12/11/10 0127      Chief Complaint  Patient presents with  . Insect Bite    HPI  History provided by the patient. Patient presents with complaints of rash and itching to his left lower leg for the past few days.  Pt states he is not sure if he was bitten by an insect.  He admits to scratching area. Patient denies any bleeding or discharge. There has been no swelling, erythema or erythematous streak. Patient denies any new loading, soaps, lotions, detergents. Patient has no known history skin conditions or allergic reactions.   Past Medical History  Diagnosis Date  . APHTHOUS ULCERS 05/30/2009  . CHALAZION, LEFT 05/30/2009  . DEPRESSION 11/01/2008  . Eating disorder   . Panic attack   . Syphilis     Past Surgical History  Procedure Date  . Tonsillectomy   . Ankle surgery     Family History  Problem Relation Age of Onset  . Alcohol abuse Neg Hx     family  . Arthritis Neg Hx     family  . Cancer Neg Hx     family hx - breast ,lung,colon,prostate ca  . Depression Neg Hx     family  . Hypertension Neg Hx     family    History  Substance Use Topics  . Smoking status: Never Smoker   . Smokeless tobacco: Not on file  . Alcohol Use: Yes      Review of Systems  Constitutional: Negative for fever and chills.  Gastrointestinal: Negative for nausea and vomiting.  Psychiatric/Behavioral: Negative for suicidal ideas.    Allergies  Cephalexin  Home Medications   Current Outpatient Rx  Name Route Sig Dispense Refill  . NAPROXEN 500 MG PO TABS Oral Take 1 tablet (500 mg total) by mouth 2 (two) times daily with a meal. 40 tablet 0  . TRIAMCINOLONE ACETONIDE 0.5 % EX OINT Topical Apply 1 application topically 2 (two) times daily. 30 g 1  . VALACYCLOVIR HCL 500 MG PO TABS Oral Take 1 tablet (500 mg total) by mouth 2 (two) times daily. For 3 days for herpes  outbreak 60 tablet 6    BP 161/76  Pulse 85  Temp(Src) 98.1 F (36.7 C) (Oral)  Resp 18  SpO2 100%  Physical Exam  Nursing note and vitals reviewed. Constitutional: He is oriented to person, place, and time. He appears well-developed and well-nourished. No distress.  HENT:  Head: Normocephalic.  Cardiovascular: Normal rate and regular rhythm.  Exam reveals no gallop and no friction rub.   No murmur heard. Pulmonary/Chest: Effort normal. He has no wheezes. He has no rales.  Abdominal: Soft. There is no tenderness.  Neurological: He is alert and oriented to person, place, and time.  Skin: Skin is warm.       Patient with dry skin lower legs. Patient has 2 cm superficial ulceration type lesion to medial left lower leg. There is no additional rash, erythema, swelling, erythematous streaks.  Psychiatric: He has a normal mood and affect.    ED Course  Procedures (including critical care time)    1. Skin ulcer      MDM  Patient seen and evaluated. Patient in no acute distress. Patient has Dry skin with cracking and small ulceration to the medial left lower leg.    Medical screening examination/treatment/procedure(s) were performed by  non-physician practitioner and as supervising physician I was immediately available for consultation/collaboration.    Phill Mutter [place], PA 12/11/10 1610  Sunnie Nielsen, MD 12/11/10 0900

## 2010-12-11 NOTE — ED Notes (Signed)
Patient was seen and assessed by the MD and then placed for D/c

## 2011-05-31 ENCOUNTER — Ambulatory Visit: Payer: Managed Care, Other (non HMO) | Admitting: Family Medicine

## 2011-05-31 ENCOUNTER — Encounter: Payer: Self-pay | Admitting: Family Medicine

## 2011-05-31 ENCOUNTER — Ambulatory Visit (INDEPENDENT_AMBULATORY_CARE_PROVIDER_SITE_OTHER): Payer: Managed Care, Other (non HMO) | Admitting: Family Medicine

## 2011-05-31 VITALS — BP 140/80 | Temp 97.8°F | Wt 182.0 lb

## 2011-05-31 DIAGNOSIS — R300 Dysuria: Secondary | ICD-10-CM

## 2011-05-31 MED ORDER — CIPROFLOXACIN HCL 500 MG PO TABS: 500.0000 mg | ORAL_TABLET | Freq: Two times a day (BID) | ORAL | Status: AC

## 2011-05-31 NOTE — Progress Notes (Signed)
  Subjective:    Patient ID: Edwin Garcia, male    DOB: Apr 24, 1980, 31 y.o.   MRN: [id number]  HPI  Acute visit. 3 day history of burning sensation only when urinating. Denies any fever or chills. No abdominal pain. No nausea or vomiting. No back pain. Remote history of STD reportedly with chlamydia. He is not aware of any obvious contacts with STD. He has had unprotected intercourse with the past couple of months. No gross hematuria. No penile discharge.   Review of Systems  Constitutional: Negative for fever and chills.  Gastrointestinal: Negative for abdominal pain.  Genitourinary: Positive for dysuria. Negative for hematuria.       Objective:   Physical Exam  Constitutional: He appears well-developed and well-nourished.  Cardiovascular: Normal rate and regular rhythm.   Pulmonary/Chest: Effort normal and breath sounds normal. No respiratory distress. He has no wheezes. He has no rales.          Assessment & Plan:  Dysuria. Check urine dipstick. Send urine for GC and Chlamydia urine probe.  Differential is acute cystitis versus urethritis.

## 2011-05-31 NOTE — Patient Instructions (Signed)

## 2011-06-01 ENCOUNTER — Ambulatory Visit: Payer: Managed Care, Other (non HMO) | Admitting: Family Medicine

## 2011-06-01 LAB — GC/CHLAMYDIA PROBE AMP, URINE: GC Probe Amp, Urine: NEGATIVE

## 2011-06-01 NOTE — Progress Notes (Signed)
Quick Note:  Pt informed ______ 

## 2011-06-03 ENCOUNTER — Telehealth: Payer: Self-pay | Admitting: *Deleted

## 2011-06-03 NOTE — Telephone Encounter (Signed)
Please check on status of urine culture. I do not see back.

## 2011-06-03 NOTE — Telephone Encounter (Signed)
Pt called requesting results of urine culture. Took last abx today. States he is still having intermittent dysuria. Please call pt on mobile or work [phone]).

## 2011-06-04 ENCOUNTER — Encounter (HOSPITAL_COMMUNITY): Payer: Self-pay | Admitting: Emergency Medicine

## 2011-06-04 ENCOUNTER — Other Ambulatory Visit: Payer: Self-pay | Admitting: *Deleted

## 2011-06-04 ENCOUNTER — Emergency Department (HOSPITAL_COMMUNITY)
Admission: EM | Admit: 2011-06-04 | Discharge: 2011-06-04 | Disposition: A | Discharge status: Home / Self Care | Payer: Managed Care, Other (non HMO) | Attending: Emergency Medicine | Admitting: Emergency Medicine

## 2011-06-04 DIAGNOSIS — N390 Urinary tract infection, site not specified: Secondary | ICD-10-CM | POA: Insufficient documentation

## 2011-06-04 DIAGNOSIS — R319 Hematuria, unspecified: Secondary | ICD-10-CM | POA: Insufficient documentation

## 2011-06-04 LAB — URINE MICROSCOPIC-ADD ON

## 2011-06-04 LAB — POCT URINALYSIS DIPSTICK
Bilirubin, UA: NEGATIVE
Spec Grav, UA: 1.02

## 2011-06-04 LAB — URINALYSIS, ROUTINE W REFLEX MICROSCOPIC
Bilirubin Urine: NEGATIVE
Nitrite: NEGATIVE
Specific Gravity, Urine: 1.032 — ABNORMAL HIGH (ref 1.005–1.030)
pH: 7 (ref 5.0–8.0)

## 2011-06-04 MED ORDER — NITROFURANTOIN MONOHYD MACRO 100 MG PO CAPS: 100.0000 mg | ORAL_CAPSULE | Freq: Two times a day (BID) | ORAL | Status: AC

## 2011-06-04 MED ORDER — NITROFURANTOIN MONOHYD MACRO 100 MG PO CAPS
100.0000 mg | ORAL_CAPSULE | Freq: Once | ORAL | Status: AC
  Administered 2011-06-04: 100 mg via ORAL
  Filled 2011-06-04: qty 1

## 2011-06-04 NOTE — ED Provider Notes (Addendum)
History     CSN: [id number]  Arrival date & time 06/04/11  1831   First MD Initiated Contact with Patient 06/04/11 2113      Chief Complaint  Patient presents with  . Hematuria    (Consider location/radiation/quality/duration/timing/severity/associated sxs/prior treatment) Patient is a 31 y.o. male presenting with hematuria. The history is provided by the patient.  Hematuria Pertinent negatives include no abdominal pain, chills, dysuria, fever, nausea or vomiting.  pt with recent blood in urine. Painless. States was seen by pcp 5 days ago w dysuria, was txd with cipro, and subsequently w macrobid, states urine culture sent but no results yet. Denies penile discharge. No scrotal/testicular pain or swelling. No recent trauma. No abdominal or flank pain. No hx kidney stones. No nv. Normal appetite. No fever or chills.     Past Medical History  Diagnosis Date  . APHTHOUS ULCERS 05/30/2009  . CHALAZION, LEFT 05/30/2009  . DEPRESSION 11/01/2008  . Eating disorder   . Panic attack   . Syphilis     Past Surgical History  Procedure Date  . Tonsillectomy   . Ankle surgery     Family History  Problem Relation Age of Onset  . Alcohol abuse Neg Hx     family  . Arthritis Neg Hx     family  . Cancer Neg Hx     family hx - breast ,lung,colon,prostate ca  . Depression Neg Hx     family  . Hypertension Neg Hx     family    History  Substance Use Topics  . Smoking status: Never Smoker   . Smokeless tobacco: Not on file  . Alcohol Use: Yes      Review of Systems  Constitutional: Negative for fever and chills.  Gastrointestinal: Negative for nausea, vomiting and abdominal pain.  Genitourinary: Positive for hematuria. Negative for dysuria and difficulty urinating.  Skin: Negative for rash and wound.    Allergies  Cephalexin  Home Medications   Current Outpatient Rx  Name Route Sig Dispense Refill  . CIPROFLOXACIN HCL 500 MG PO TABS Oral Take 1 tablet (500 mg total)  by mouth 2 (two) times daily. 6 tablet 0  . NAPROXEN 500 MG PO TABS Oral Take 500 mg by mouth as needed. pain    . NITROFURANTOIN MONOHYD MACRO 100 MG PO CAPS Oral Take 1 capsule (100 mg total) by mouth 2 (two) times daily. 10 capsule 0    BP 147/82  Pulse 66  Temp(Src) 99.3 F (37.4 C) (Oral)  Resp 16  SpO2 99%  Physical Exam  Nursing note and vitals reviewed. Constitutional: He is oriented to person, place, and time. He appears well-developed and well-nourished. No distress.  HENT:  Head: Atraumatic.  Eyes: Conjunctivae are normal.  Neck: Neck supple. No tracheal deviation present.  Cardiovascular: Normal rate.   Pulmonary/Chest: Effort normal. No accessory muscle usage. No respiratory distress.  Abdominal: Soft. He exhibits no distension and no mass. There is no tenderness.  Genitourinary:       No cva tenderness. Normal external genitalia, testes desc bilaterally, non tender. No hernia. No penile discharge. o ulcers/genital lesions.   Musculoskeletal: Normal range of motion. He exhibits no edema.  Neurological: He is alert and oriented to person, place, and time.  Skin: Skin is warm and dry. No rash noted.  Psychiatric: He has a normal mood and affect.    ED Course  Procedures (including critical care time)   Labs Reviewed  URINALYSIS, ROUTINE W  REFLEX MICROSCOPIC    Results for orders placed during the hospital encounter of 06/04/11  URINALYSIS, ROUTINE W REFLEX MICROSCOPIC      Component Value Range   Color, Urine YELLOW  YELLOW    APPearance CLOUDY (*) CLEAR    Specific Gravity, Urine 1.032 (*) 1.005 - 1.030    pH 7.0  5.0 - 8.0    Glucose, UA NEGATIVE  NEGATIVE (mg/dL)   Hgb urine dipstick LARGE (*) NEGATIVE    Bilirubin Urine NEGATIVE  NEGATIVE    Ketones, ur NEGATIVE  NEGATIVE (mg/dL)   Protein, ur NEGATIVE  NEGATIVE (mg/dL)   Urobilinogen, UA 1.0  0.0 - 1.0 (mg/dL)   Nitrite NEGATIVE  NEGATIVE    Leukocytes, UA SMALL (*) NEGATIVE   URINE MICROSCOPIC-ADD  ON      Component Value Range   Squamous Epithelial / LPF FEW (*) RARE    WBC, UA 11-20  <3 (WBC/hpf)   RBC / HPF TOO NUMEROUS TO COUNT  <3 (RBC/hpf)   Urine-Other MUCOUS PRESENT         MDM  Labs sent.  Nursing notes and prior charts reviewed - additional history obtained.   Reviewed prior charts - appears pt has been seen by pcp service multiple times in past several years for c/o hematuria, other gu symptoms, multiple prior gc/chlamydia tests, hiv tests, negative. Prior abd ct neg.   Discussed w pt, given recurrent hematuria, follow up urology.   ua with 11-20 wbc, u cx sent. Gc/chlam from just a couple days ago neg, no penile discharge.   Will rx abx. Urology follow up. Pt states was just given new abx rx today by pcp (macrobid), therefore will have start that pending cx results.       Suzi Roots, MD 06/04/11 2204  Suzi Roots, MD 06/04/11 2217

## 2011-06-04 NOTE — Discharge Instructions (Signed)
Complete the full course of your new antibiotic. A urine culture was sent the results of which will be back in 2 days - have your doctor follow up on that result then.  Given recurrent blood in urine, recurrent urine infection - follow up with urologist in the next few days - call office Monday to arrange follow up with them.  Return to ER if worse, severe pain, unable to void, high fevers, other concern.     Hematuria, Adult Hematuria (blood in your urine) can be caused by a bladder infection (cystitis), kidney infection (pyelonephritis), prostate infection (prostatitis), or kidney stone. Infections will usually respond to antibiotics (medications which kill germs), and a kidney stone will usually pass through your urine without further treatment. If you were put on antibiotics, take all the medicine until gone. You may feel better in a few days, but take all of your medicine or the infection may not respond and become more difficult to treat. If antibiotics were not given, an infection did not cause the blood in the urine. A further work up to find out the reason may be needed. HOME CARE INSTRUCTIONS   Drink lots of fluid, 3 to 4 quarts a day. If you have been diagnosed with an infection, cranberry juice is especially recommended, in addition to large amounts of water.   Avoid caffeine, tea, and carbonated beverages, because they tend to irritate the bladder.   Avoid alcohol as it may irritate the prostate.   Only take over-the-counter or prescription medicines for pain, discomfort, or fever as directed by your caregiver.   If you have been diagnosed with a kidney stone follow your caregivers instructions regarding straining your urine to catch the stone.  TO PREVENT FURTHER INFECTIONS:  Empty the bladder often. Avoid holding urine for long periods of time.   After a bowel movement, women should cleanse front to back. Use each tissue only once.   Empty the bladder before and after sexual  intercourse if you are a male.   Return to your caregiver if you develop back pain, fever, nausea (feeling sick to your stomach), vomiting, or your symptoms (problems) are not better in 3 days. Return sooner if you are getting worse.  If you have been requested to return for further testing make sure to keep your appointments. If an infection is not the cause of blood in your urine, X-rays may be required. Your caregiver will discuss this with you. SEEK IMMEDIATE MEDICAL CARE IF:   You have a persistent fever over 102 F (38.9 C).   You develop severe vomiting and are unable to keep the medication down.   You develop severe back or abdominal pain despite taking your medications.   You begin passing a large amount of blood or clots in your urine.   You feel extremely weak or faint, or pass out.  MAKE SURE YOU:   Understand these instructions.   Will watch your condition.   Will get help right away if you are not doing well or get worse.  Document Released: 01/04/2005 Document Revised: 12/24/2010 Document Reviewed: 08/24/2007 [facility] Patient Information 2012 [place], [place].      Urinary Tract Infection Infections of the urinary tract can start in several places. A bladder infection (cystitis), a kidney infection (pyelonephritis), and a prostate infection (prostatitis) are different types of urinary tract infections (UTIs). They usually get better if treated with medicines (antibiotics) that kill germs. Take all the medicine until it is gone. You or your  child may feel better in a few days, but TAKE ALL MEDICINE or the infection may not respond and may become more difficult to treat. HOME CARE INSTRUCTIONS   Drink enough water and fluids to keep the urine clear or pale yellow. Cranberry juice is especially recommended, in addition to large amounts of water.   Avoid caffeine, tea, and carbonated beverages. They tend to irritate the bladder.   Alcohol may irritate the prostate.     Only take over-the-counter or prescription medicines for pain, discomfort, or fever as directed by your caregiver.  To prevent further infections:  Empty the bladder often. Avoid holding urine for long periods of time.   After a bowel movement, women should cleanse from front to back. Use each tissue only once.   Empty the bladder before and after sexual intercourse.  FINDING OUT THE RESULTS OF YOUR TEST Not all test results are available during your visit. If your or your child's test results are not back during the visit, make an appointment with your caregiver to find out the results. Do not assume everything is normal if you have not heard from your caregiver or the medical facility. It is important for you to follow up on all test results. SEEK MEDICAL CARE IF:   There is back pain.   Your baby is older than 3 months with a rectal temperature of 100.5 F (38.1 C) or higher for more than 1 day.   Your or your child's problems (symptoms) are no better in 3 days. Return sooner if you or your child is getting worse.  SEEK IMMEDIATE MEDICAL CARE IF:   There is severe back pain or lower abdominal pain.   You or your child develops chills.   You have a fever.   Your baby is older than 3 months with a rectal temperature of 102 F (38.9 C) or higher.   Your baby is 44 months old or younger with a rectal temperature of 100.4 F (38 C) or higher.   There is nausea or vomiting.   There is continued burning or discomfort with urination.  MAKE SURE YOU:   Understand these instructions.   Will watch your condition.   Will get help right away if you are not doing well or get worse.  Document Released: 10/14/2004 Document Revised: 12/24/2010 Document Reviewed: 05/19/2006 [facility] Patient Information 2012 [place], [place].

## 2011-06-04 NOTE — ED Notes (Signed)
Pt states he started having blood in his urine today  Pt states he went to his dr today and they did a UA but did not want to see him again til Monday but pt states he is having pain in his penis and testicle area  Pt states he was tested for STDs Monday and they came back negative  Pt states they started him on an antibiotic Monday cipro which he finished today and he started taking nitrofurantoin mono/mac 100mg  twice a day today

## 2011-06-04 NOTE — Progress Notes (Signed)
Quick Note:  Pt informed ______ 

## 2011-06-04 NOTE — Telephone Encounter (Signed)
Gwenn, please help, and thanks.

## 2011-06-06 LAB — URINE CULTURE: Colony Count: 1000

## 2011-06-07 LAB — URINE CULTURE

## 2011-08-03 ENCOUNTER — Ambulatory Visit (INDEPENDENT_AMBULATORY_CARE_PROVIDER_SITE_OTHER): Payer: Managed Care, Other (non HMO) | Admitting: Family Medicine

## 2011-08-03 ENCOUNTER — Encounter: Payer: Self-pay | Admitting: Family Medicine

## 2011-08-03 ENCOUNTER — Ambulatory Visit: Payer: Managed Care, Other (non HMO) | Admitting: Family Medicine

## 2011-08-03 VITALS — BP 120/78 | Temp 98.7°F | Wt 186.0 lb

## 2011-08-03 DIAGNOSIS — Z113 Encounter for screening for infections with a predominantly sexual mode of transmission: Secondary | ICD-10-CM

## 2011-08-03 MED ORDER — VALACYCLOVIR HCL 500 MG PO TABS: 500.0000 mg | ORAL_TABLET | Freq: Two times a day (BID) | ORAL | Status: DC

## 2011-08-03 NOTE — Patient Instructions (Addendum)
Sexually Transmitted Disease Sexually transmitted disease (STD) refers to any infection that is passed from person to person during sexual activity. This may happen by way of saliva, semen, blood, vaginal mucus, or urine. Common STDs include:  Gonorrhea.   Chlamydia.   Syphilis.   HIV/AIDS.   Genital herpes.   Hepatitis B and C.   Trichomonas.   Human papillomavirus (HPV).   Pubic lice.  CAUSES  An STD may be spread by bacteria, virus, or parasite. A person can get an STD by:  Sexual intercourse with an infected person.   Sharing sex toys with an infected person.   Sharing needles with an infected person.   Having intimate contact with the genitals, mouth, or rectal areas of an infected person.  SYMPTOMS  Some people may not have any symptoms, but they can still pass the infection to others. Different STDs have different symptoms. Symptoms include:  Painful or bloody urination.   Pain in the pelvis, abdomen, vagina, anus, throat, or eyes.   Skin rash, itching, irritation, growths, or sores (lesions). These usually occur in the genital or anal area.   Abnormal vaginal discharge.   Penile discharge in men.   Soft, flesh-colored skin growths in the genital or anal area.   Fever.   Pain or bleeding during sexual intercourse.   Swollen glands in the groin area.   Yellow skin and eyes (jaundice). This is seen with hepatitis.  DIAGNOSIS  To make a diagnosis, your caregiver may:  Take a medical history.   Perform a physical exam.   Take a specimen (culture) to be examined.   Examine a sample of discharge under a microscope.   Perform blood tests.   Perform a Pap test, if this applies.   Perform a colposcopy.   Perform a laparoscopy.  TREATMENT   Chlamydia, gonorrhea, trichomonas, and syphilis can be cured with antibiotic medicine.   Genital herpes, hepatitis, and HIV can be treated, but not cured, with prescribed medicines. The medicines will lessen  the symptoms.   Genital warts from HPV can be treated with medicine or by freezing, burning (electrocautery), or surgery. Warts may come back.   HPV is a virus and cannot be cured with medicine or surgery.However, abnormal areas may be followed very closely by your caregiver and may be removed from the cervix, vagina, or vulva through office procedures or surgery.  If your diagnosis is confirmed, your recent sexual partners need treatment. This is true even if they are symptom-free or have a negative culture or evaluation. They should not have sex until their caregiver says it is okay. HOME CARE INSTRUCTIONS  All sexual partners should be informed, tested, and treated for all STDs.   Take your antibiotics as directed. Finish them even if you start to feel better.   Only take over-the-counter or prescription medicines for pain, discomfort, or fever as directed by your caregiver.   Rest.   Eat a balanced diet and drink enough fluids to keep your urine clear or pale yellow.   Do not have sex until treatment is completed and you have followed up with your caregiver. STDs should be checked after treatment.   Keep all follow-up appointments, Pap tests, and blood tests as directed by your caregiver.   Only use latex condoms and water-soluble lubricants during sexual activity. Do not use petroleum jelly or oils.   Avoid alcohol and illegal drugs.   Get vaccinated for HPV and hepatitis. If you have not received these vaccines   in the past, talk to your caregiver about whether one or both might be right for you.   Avoid risky sex practices that can break the skin.  The only way to avoid getting an STD is to avoid all sexual activity.Latex condoms and dental dams (for oral sex) will help lessen the risk of getting an STD, but will not completely eliminate the risk. SEEK MEDICAL CARE IF:   You have a fever.   You have any new or worsening symptoms.  Document Released: 03/27/2002 Document  Revised: 12/24/2010 Document Reviewed: 04/03/2010 [employer] Patient Information 2012 [employer]. 

## 2011-08-03 NOTE — Progress Notes (Signed)
  Subjective:    Patient ID: Edwin Garcia, male    DOB: 03-23-1980, 31 y.o.   MRN: [id number]  HPI  Here to discuss STD screening. Patient recently broke up with boyfriend who he had regular sexual relationships for several months. No receptive anal intercourse.  Apparently his boyfriend had been with others recently and thus patient's concern about possible exposure to STD though he has not had any new symptoms. Denies any urinary symptoms. No penile discharge. Past has history of herpes and has occasional outbreaks. Had some minimal perianal itching. He treated himself with Rid and symptoms have improved. No fever. No chills. No appetite or weight changes. Uses Valtrex for periodic outbreaks of herpes and requesting refill.  No consistent use of barrier protection   Review of Systems  Constitutional: Negative for fever and chills.  Respiratory: Negative for cough and shortness of breath.   Gastrointestinal: Negative for abdominal pain.  Genitourinary: Negative for dysuria.       Objective:   Physical Exam  Constitutional: He appears well-developed and well-nourished.  Cardiovascular: Normal rate and regular rhythm.   Pulmonary/Chest: Effort normal and breath sounds normal. No respiratory distress. He has no wheezes. He has no rales.  Genitourinary: Penis normal.       No major inguinal adenopathy.  Skin: No rash noted.          Assessment & Plan:  STD screening/counseling. Discussed barrier protection and prevention issues. Check urine probe for GC and Chlamydia, RPR, and HIV. Refill Valtrex for as needed use.  Encouraged to check on insurance coverage for HPV vaccine.

## 2011-08-04 LAB — GC/CHLAMYDIA PROBE AMP, URINE: GC Probe Amp, Urine: NEGATIVE

## 2011-08-04 LAB — RPR: RPR Ser Ql: REACTIVE — AB

## 2011-08-05 ENCOUNTER — Telehealth: Payer: Self-pay | Admitting: *Deleted

## 2011-08-05 NOTE — Progress Notes (Signed)
Quick Note:  I spoke with pt, he voiced his understanding. He is scheduled for injection appt only Friday afternoon. Will fill out [facility] reporting form and fax after he has his first injection ______

## 2011-08-05 NOTE — Telephone Encounter (Signed)
Pt informed positive lab test for syphilis screen, all other STD tests negative.  He is scheduled for inj appt Friday, 7/19.  We discussed his allergy to Cephalaxin, he denies any rash or swelling, mostly stomach upset.  Pt states he has had Penicillin before with no reaction.  Pt will be informed of need to report this to [facility] Department by law.

## 2011-08-06 ENCOUNTER — Ambulatory Visit (INDEPENDENT_AMBULATORY_CARE_PROVIDER_SITE_OTHER): Payer: Managed Care, Other (non HMO) | Admitting: Family Medicine

## 2011-08-06 DIAGNOSIS — Z202 Contact with and (suspected) exposure to infections with a predominantly sexual mode of transmission: Secondary | ICD-10-CM

## 2011-08-06 DIAGNOSIS — A539 Syphilis, unspecified: Secondary | ICD-10-CM

## 2011-08-06 DIAGNOSIS — Z2089 Contact with and (suspected) exposure to other communicable diseases: Secondary | ICD-10-CM

## 2011-08-06 MED ORDER — PENICILLIN G BENZATHINE 1200000 UNIT/2ML IM SUSP
1.2000 10*6.[IU] | Freq: Once | INTRAMUSCULAR | Status: AC
  Administered 2011-08-06: 1.2 10*6.[IU] via INTRAMUSCULAR

## 2011-08-09 ENCOUNTER — Telehealth: Payer: Self-pay | Admitting: Family Medicine

## 2011-08-09 NOTE — Telephone Encounter (Signed)
[facility] called Re: positive lab result. Req to speak to Dr Caryl Never or nurse about result.

## 2011-08-10 NOTE — Telephone Encounter (Signed)
I called Edwin Garcia back as requested , answered question re: haw Dr Caryl Never ever tested labs for this pt in the past, yes.  [facility] received pos RPR labs from the lab that tested the blood.  Albert requested the forms we faxed to the [facility].  I did fax records, confirmation received

## 2011-08-18 ENCOUNTER — Ambulatory Visit (INDEPENDENT_AMBULATORY_CARE_PROVIDER_SITE_OTHER): Payer: Managed Care, Other (non HMO) | Admitting: Family Medicine

## 2011-08-18 ENCOUNTER — Encounter: Payer: Self-pay | Admitting: Family Medicine

## 2011-08-18 VITALS — BP 120/82 | Temp 98.6°F | Wt 184.0 lb

## 2011-08-18 DIAGNOSIS — S93409A Sprain of unspecified ligament of unspecified ankle, initial encounter: Secondary | ICD-10-CM

## 2011-08-18 DIAGNOSIS — Z8619 Personal history of other infectious and parasitic diseases: Secondary | ICD-10-CM

## 2011-08-18 DIAGNOSIS — S93401A Sprain of unspecified ligament of right ankle, initial encounter: Secondary | ICD-10-CM

## 2011-08-18 MED ORDER — NAPROXEN 500 MG PO TABS: 500.0000 mg | ORAL_TABLET | Freq: Two times a day (BID) | ORAL | Status: DC

## 2011-08-18 NOTE — Progress Notes (Signed)
  Subjective:    Patient ID: Edwin Garcia, male    DOB: 1980/04/01, 31 y.o.   MRN: [id number]  HPI  Acute visit. Right ankle pain. Tuesday he was getting out of his truck and stepped backwards. Unsure of exact mechanism. Medial right ankle pain. Remote history of trauma age 55 with repeat fracture age 75 and pinning at that time. He has some mild edema and has been icing and elevating. No medications. No instability. Pain is moderate.   Review of Systems  Musculoskeletal: Negative for gait problem.  Neurological: Negative for weakness.       Objective:   Physical Exam  Constitutional: He appears well-developed and well-nourished.  Cardiovascular: Normal rate and regular rhythm.   Pulmonary/Chest: Effort normal and breath sounds normal.  Musculoskeletal:       Right ankle reveals no ecchymosis. He has mild edema medially. He has some minimal tenderness below the medial malleolus. Slightly limited range of motion in inversion and eversion right versus left ankle questionably related to prior surgery. No warmth or erythema          Assessment & Plan:  Right ankle sprain. Prior history of trauma as above. Ace wrap provided. Elevation and frequent icing. Naproxen 500 mg twice a day with food. Consider x-rays if not improving over the next couple of weeks

## 2011-08-18 NOTE — Patient Instructions (Addendum)
Ankle Sprain An ankle sprain is an injury to the strong, fibrous tissues (ligaments) that hold the bones of your ankle joint together.  CAUSES Ankle sprain usually is caused by a fall or by twisting your ankle. People who participate in sports are more prone to these types of injuries.  SYMPTOMS  Symptoms of ankle sprain include:  Pain in your ankle. The pain may be present at rest or only when you are trying to stand or walk.   Swelling.   Bruising. Bruising may develop immediately or within 1 to 2 days after your injury.   Difficulty standing or walking.  DIAGNOSIS  Your caregiver will ask you details about your injury and perform a physical exam of your ankle to determine if you have an ankle sprain. During the physical exam, your caregiver will press and squeeze specific areas of your foot and ankle. Your caregiver will try to move your ankle in certain ways. An X-ray exam may be done to be sure a bone was not broken or a ligament did not separate from one of the bones in your ankle (avulsion).  TREATMENT  Certain types of braces can help stabilize your ankle. Your caregiver can make a recommendation for this. Your caregiver may recommend the use of medication for pain. If your sprain is severe, your caregiver may refer you to a surgeon who helps to restore function to parts of your skeletal system (orthopedist) or a physical therapist. HOME CARE INSTRUCTIONS  Apply ice to your injury for 1 to 2 days or as directed by your caregiver. Applying ice helps to reduce inflammation and pain.  Put ice in a plastic bag.   Place a towel between your skin and the bag.   Leave the ice on for 15 to 20 minutes at a time, every 2 hours while you are awake.   Take over-the-counter or prescription medicines for pain, discomfort, or fever only as directed by your caregiver.   Keep your injured leg elevated, when possible, to lessen swelling.   If your caregiver recommends crutches, use them as  instructed. Gradually, put weight on the affected ankle. Continue to use crutches or a cane until you can walk without feeling pain in your ankle.   If you have a plaster splint, wear the splint as directed by your caregiver. Do not rest it on anything harder than a pillow the first 24 hours. Do not put weight on it. Do not get it wet. You may take it off to take a shower or bath.   You may have been given an elastic bandage to wear around your ankle to provide support. If the elastic bandage is too tight (you have numbness or tingling in your foot or your foot becomes cold and blue), adjust the bandage to make it comfortable.   If you have an air splint, you may blow more air into it or let air out to make it more comfortable. You may take your splint off at night and before taking a shower or bath.   Wiggle your toes in the splint several times per day if you are able.  SEEK MEDICAL CARE IF:   You have an increase in bruising, swelling, or pain.   Your toes feel cold.   Pain relief is not achieved with medication.  SEEK IMMEDIATE MEDICAL CARE IF: Your toes are numb or blue or you have severe pain. MAKE SURE YOU:   Understand these instructions.   Will watch your condition.     Will get help right away if you are not doing well or get worse.  Document Released: 01/04/2005 Document Revised: 12/24/2010 Document Reviewed: 08/09/2007 [employer] Patient Information 2012 [employer]. 

## 2011-09-08 ENCOUNTER — Emergency Department (HOSPITAL_COMMUNITY)
Admission: EM | Admit: 2011-09-08 | Discharge: 2011-09-08 | Disposition: A | Discharge status: Home / Self Care | Payer: Managed Care, Other (non HMO) | Attending: Emergency Medicine | Admitting: Emergency Medicine

## 2011-09-08 DIAGNOSIS — L03119 Cellulitis of unspecified part of limb: Secondary | ICD-10-CM | POA: Insufficient documentation

## 2011-09-08 DIAGNOSIS — L0291 Cutaneous abscess, unspecified: Secondary | ICD-10-CM

## 2011-09-08 DIAGNOSIS — L02419 Cutaneous abscess of limb, unspecified: Secondary | ICD-10-CM | POA: Insufficient documentation

## 2011-09-08 MED ORDER — SULFAMETHOXAZOLE-TRIMETHOPRIM 800-160 MG PO TABS: 1.0000 | ORAL_TABLET | Freq: Two times a day (BID) | ORAL | Status: DC

## 2011-09-08 MED ORDER — BACITRACIN 500 UNIT/GM EX OINT
1.0000 "application " | TOPICAL_OINTMENT | Freq: Two times a day (BID) | CUTANEOUS | Status: DC
  Administered 2011-09-08: 1 via TOPICAL
  Filled 2011-09-08 (×2): qty 0.9

## 2011-09-08 MED ORDER — ACETAMINOPHEN 325 MG PO TABS
975.0000 mg | ORAL_TABLET | Freq: Once | ORAL | Status: AC
  Administered 2011-09-08: 975 mg via ORAL
  Filled 2011-09-08: qty 3

## 2011-09-08 MED ORDER — HYDROCODONE-ACETAMINOPHEN 5-325 MG PO TABS: 2.0000 | ORAL_TABLET | ORAL | Status: AC | PRN

## 2011-09-08 MED ORDER — SULFAMETHOXAZOLE-TMP DS 800-160 MG PO TABS
1.0000 | ORAL_TABLET | Freq: Once | ORAL | Status: AC
  Administered 2011-09-08: 1 via ORAL
  Filled 2011-09-08: qty 1

## 2011-09-08 NOTE — ED Provider Notes (Signed)
History   This chart was scribed for Edwin Sou, MD by Charolett Bumpers . The patient was seen in room TR08C/TR08C. Patient's care was started at 1146.    CSN: [id number]  Arrival date & time 09/08/11  1042   First MD Initiated Contact with Patient 09/08/11 1146      Chief Complaint  Patient presents with  . Recurrent Skin Infections    (Consider location/radiation/quality/duration/timing/severity/associated sxs/prior treatment) HPI Edwin Garcia is a 31 y.o. male who presents to the Emergency Department complaining of constant, moderate, worsening abscess located on his inner right upper thigh with an onset of 2 days ago. Pt reports associated redness, swelling and pain that is aggravated with walking. Pt reports a h/o similar abscesses at his left thigh that he lanced at home. The abscess of left thigh is no longer painful and is feeling well. Pt also reports associated chills and fever of 102. Pt reports that he took a BC powders yesterday and this morning around 8 am with minimal relief. Temperature here in ED is 98.8. Pt reports a h/o similar symptoms, more so when he was overweight. Pt denies any pertinent medical or surgical hx. Pt denies tobacco, alcohol or drug use. Pt reports that his tetanus is UTD, within the last 10 years.   PCP: Dr. Caryl Never  Past Medical History  Diagnosis Date  . APHTHOUS ULCERS 05/30/2009  . CHALAZION, LEFT 05/30/2009  . DEPRESSION 11/01/2008  . Eating disorder   . Panic attack   . Syphilis     Past Surgical History  Procedure Date  . Tonsillectomy   . Ankle surgery     Family History  Problem Relation Age of Onset  . Alcohol abuse Neg Hx     family  . Arthritis Neg Hx     family  . Cancer Neg Hx     family hx - breast ,lung,colon,prostate ca  . Depression Neg Hx     family  . Hypertension Neg Hx     family    History  Substance Use Topics  . Smoking status: Never Smoker   . Smokeless tobacco: Not on file  .  Alcohol Use: Yes     STD  Review of Systems  Constitutional: Positive for fever and chills.  Musculoskeletal: Negative.        Abscess.   Skin: Positive for wound.  Neurological: Negative.   Hematological: Negative.   Psychiatric/Behavioral: Negative.   All other systems reviewed and are negative.    Allergies  Cephalexin  Home Medications  No current outpatient prescriptions on file.  BP 134/78  Pulse 86  Temp 98.8 F (37.1 C) (Oral)  Resp 16  SpO2 98%  Physical Exam  Nursing note and vitals reviewed. Constitutional: He appears well-developed and well-nourished.  HENT:  Head: Normocephalic and atraumatic.  Eyes: Conjunctivae are normal. Pupils are equal, round, and reactive to light.  Neck: Neck supple. No tracheal deviation present. No thyromegaly present.  Cardiovascular: Normal rate and regular rhythm.   No murmur heard. Pulmonary/Chest: Effort normal and breath sounds normal.  Abdominal: Soft. Bowel sounds are normal. He exhibits no distension. There is no tenderness.  Musculoskeletal: Normal range of motion. He exhibits no edema and no tenderness.  Neurological: He is alert. Coordination normal.  Skin: Skin is warm and dry. No rash noted.       On inner right upper thigh an area of fluctuance 2 cm in diameter surrounded with 10 cm of erythema. No inguinal  nodes   Psychiatric: He has a normal mood and affect.   Left medial thigh 1 cm well-healing wound, nontender ED Course  Procedures (including critical care time)    DIAGNOSTIC STUDIES: Oxygen Saturation is 98% on room air, normal by my interpretation.    COORDINATION OF CARE:  11:50-Discussed planned course of treatment with the patient including an I & D, who is agreeable at this time.   11:53-preformed I &D of abscess located on right inner thigh.   Labs Reviewed - No data to display No results found. Time out performed  No diagnosis found. INCISION AND DRAINAGE Performed by:  Lyla Glassing thighnsent: Verbal consent obtained. Risks and benefits: risks, benefits and alternatives were discussed Type: abscess  Body area: right thigh  Anesthesia: local infiltration  Local anesthetic: lidocaine 2% with epinephrine  moderate Anesthetic total: 3 ml  Complexity: complex Blunt dissection to break up loculations  Drainage: purulent  Drainage amount:     Patient tolerance: Patient tolerated the procedure well with no immediate complications.     MDM  Plan prescription Bactrim DS, Vicodin, bacitracin ointment. Wound recheck 2 days  Diagnosis #1 abscess right thigh #2 healed abscess left thigh      I personally performed the services described in this documentation, which was scribed in my presence. The recorded information has been reviewed and considered.    Edwin Sou, MD 09/08/11 1208

## 2011-09-08 NOTE — ED Notes (Signed)
Pt states he has a boil on the inside of his R upper thigh. States he had 2 others that he lanced himself. Inside of R thigh is red and swollen.

## 2011-09-10 ENCOUNTER — Encounter: Payer: Self-pay | Admitting: Family Medicine

## 2011-09-10 ENCOUNTER — Ambulatory Visit (INDEPENDENT_AMBULATORY_CARE_PROVIDER_SITE_OTHER): Payer: Managed Care, Other (non HMO) | Admitting: Family Medicine

## 2011-09-10 VITALS — BP 110/74 | Temp 98.7°F | Wt 180.0 lb

## 2011-09-10 DIAGNOSIS — L03115 Cellulitis of right lower limb: Secondary | ICD-10-CM

## 2011-09-10 DIAGNOSIS — L03119 Cellulitis of unspecified part of limb: Secondary | ICD-10-CM

## 2011-09-10 MED ORDER — DOXYCYCLINE HYCLATE 100 MG PO TABS: 100.0000 mg | ORAL_TABLET | Freq: Two times a day (BID) | ORAL | Status: AC

## 2011-09-10 NOTE — Patient Instructions (Addendum)
Warm soaks or warm compresses to thigh several times daily Follow up for any fever Elevate leg frequently

## 2011-09-10 NOTE — Progress Notes (Signed)
  Subjective:    Patient ID: Edwin Garcia, male    DOB: 08-01-1980, 31 y.o.   MRN: [id number]  HPI  Emergency room followup. Presented there 2 days ago with right upper inner thigh abscess. No fever. Patient had incision and drainage and wound was packed. No culture. Patient started on Septra DS but had headache and nausea and vomiting and stopped this after yesterday. He has some surrounding redness but no fluctuance. Minimal drainage. He's had no further nausea or vomiting since stopping Septra. No prior history of MRSA   Review of Systems  Constitutional: Negative for fever and chills.  Musculoskeletal: Negative for gait problem.  Hematological: Negative for adenopathy.       Objective:   Physical Exam  Constitutional: He appears well-developed and well-nourished.  Cardiovascular: Normal rate and regular rhythm.   Skin:       Thigh abscess healing well. Wound edges are sealed back together. No fluctuance. Nontender. Surrounding area of erythema approximately 20 x 15 cm. Warm to touch.          Assessment & Plan:  Cellulitis right thigh -following abscess. Patient intolerant of Septra. Start doxycycline 100 mg twice a day for 10 days. Warm compresses. Dressing reapplied. No evidence for persistent abscess. Followup promptly for any fever worsening symptoms

## 2011-10-01 ENCOUNTER — Encounter: Payer: Self-pay | Admitting: Family Medicine

## 2011-10-01 ENCOUNTER — Ambulatory Visit (INDEPENDENT_AMBULATORY_CARE_PROVIDER_SITE_OTHER): Payer: Managed Care, Other (non HMO) | Admitting: Family Medicine

## 2011-10-01 VITALS — BP 120/90 | Temp 98.0°F | Wt 186.0 lb

## 2011-10-01 DIAGNOSIS — A088 Other specified intestinal infections: Secondary | ICD-10-CM

## 2011-10-01 DIAGNOSIS — A084 Viral intestinal infection, unspecified: Secondary | ICD-10-CM

## 2011-10-01 MED ORDER — ONDANSETRON 8 MG PO TBDP: 8.0000 mg | ORAL_TABLET | Freq: Three times a day (TID) | ORAL | Status: DC | PRN

## 2011-10-01 NOTE — Progress Notes (Signed)
  Subjective:    Patient ID: Edwin Garcia, male    DOB: [birth year], 31 y.o.   MRN: [id number]  HPI  Acute visit. Patient seen with onset yesterday of nausea, vomiting, fever to 101, malaise, and body aches. No headaches. Kept down a few sips last night. Had about 2-3 episodes of nonbloody diarrhea yesterday. One today. No cough or any other respiratory symptoms. No sick contacts. No significant abdominal pain.   Review of Systems  Constitutional: Positive for fever, chills and appetite change.  Cardiovascular: Negative for chest pain.  Gastrointestinal: Positive for nausea, vomiting and diarrhea. Negative for abdominal pain, constipation and blood in stool.  Neurological: Negative for dizziness and syncope.       Objective:   Physical Exam  Constitutional: He appears well-developed and well-nourished.  HENT:  Mouth/Throat: Oropharynx is clear and moist.  Neck: Neck supple.  Cardiovascular: Normal rate and regular rhythm.   Pulmonary/Chest: Effort normal and breath sounds normal. No respiratory distress. He has no wheezes. He has no rales.  Abdominal: Soft. Bowel sounds are normal. He exhibits no distension and no mass. There is no tenderness. There is no rebound and no guarding.          Assessment & Plan:  Probable viral gastroenteritis. Zofran 8 mg every 8 hours when necessary for nausea and vomiting. Work on fluids, then bland diet as tolerated as symptoms improve

## 2011-10-01 NOTE — Patient Instructions (Addendum)
Viral Gastroenteritis Viral gastroenteritis is also known as stomach flu. This condition affects the stomach and intestinal tract. It can cause sudden diarrhea and vomiting. The illness typically lasts 3 to 8 days. Most people develop an immune response that eventually gets rid of the virus. While this natural response develops, the virus can make you quite ill. CAUSES  Many different viruses can cause gastroenteritis, such as rotavirus or noroviruses. You can catch one of these viruses by consuming contaminated food or water. You may also catch a virus by sharing utensils or other personal items with an infected person or by touching a contaminated surface. SYMPTOMS  The most common symptoms are diarrhea and vomiting. These problems can cause a severe loss of body fluids (dehydration) and a body salt (electrolyte) imbalance. Other symptoms may include:  Fever.   Headache.   Fatigue.   Abdominal pain.  DIAGNOSIS  Your caregiver can usually diagnose viral gastroenteritis based on your symptoms and a physical exam. A stool sample may also be taken to test for the presence of viruses or other infections. TREATMENT  This illness typically goes away on its own. Treatments are aimed at rehydration. The most serious cases of viral gastroenteritis involve vomiting so severely that you are not able to keep fluids down. In these cases, fluids must be given through an intravenous line (IV). HOME CARE INSTRUCTIONS   Drink enough fluids to keep your urine clear or pale yellow. Drink small amounts of fluids frequently and increase the amounts as tolerated.   Ask your caregiver for specific rehydration instructions.   Avoid:   Foods high in sugar.   Alcohol.   Carbonated drinks.   Tobacco.   Juice.   Caffeine drinks.   Extremely hot or cold fluids.   Fatty, greasy foods.   Too much intake of anything at one time.   Dairy products until 24 to 48 hours after diarrhea stops.   You may  consume probiotics. Probiotics are active cultures of beneficial bacteria. They may lessen the amount and number of diarrheal stools in adults. Probiotics can be found in yogurt with active cultures and in supplements.   Wash your hands well to avoid spreading the virus.   Only take over-the-counter or prescription medicines for pain, discomfort, or fever as directed by your caregiver. Do not give aspirin to children. Antidiarrheal medicines are not recommended.   Ask your caregiver if you should continue to take your regular prescribed and over-the-counter medicines.   Keep all follow-up appointments as directed by your caregiver.  SEEK IMMEDIATE MEDICAL CARE IF:   You are unable to keep fluids down.   You do not urinate at least once every 6 to 8 hours.   You develop shortness of breath.   You notice blood in your stool or vomit. This may look like coffee grounds.   You have abdominal pain that increases or is concentrated in one small area (localized).   You have persistent vomiting or diarrhea.   You have a fever.   The patient is a child younger than 3 months, and he or she has a fever.   The patient is a child older than 3 months, and he or she has a fever and persistent symptoms.   The patient is a child older than 3 months, and he or she has a fever and symptoms suddenly get worse.   The patient is a baby, and he or she has no tears when crying.  MAKE SURE YOU:     Understand these instructions.   Will watch your condition.   Will get help right away if you are not doing well or get worse.  Document Released: 01/04/2005 Document Revised: 12/24/2010 Document Reviewed: 10/21/2010 [employer] Patient Information 2012 [employer]. 

## 2011-10-05 ENCOUNTER — Telehealth: Payer: Self-pay | Admitting: Family Medicine

## 2011-10-05 NOTE — Telephone Encounter (Signed)
Generic zofran written for 15 pills. Can get 12 pills every 25 days with no Prior Auth. Is this an option? Please advise. Thanks.

## 2011-10-06 NOTE — Telephone Encounter (Signed)
Yes, this is a PRN med, thank you

## 2011-10-06 NOTE — Telephone Encounter (Signed)
OK. Please send in to the pharmacy for 12, instead of the 15. Thanks!

## 2011-11-02 ENCOUNTER — Encounter: Payer: Self-pay | Admitting: Internal Medicine

## 2011-11-02 ENCOUNTER — Ambulatory Visit (INDEPENDENT_AMBULATORY_CARE_PROVIDER_SITE_OTHER): Payer: Managed Care, Other (non HMO) | Admitting: Internal Medicine

## 2011-11-02 VITALS — BP 120/80 | Temp 98.1°F | Wt 188.0 lb

## 2011-11-02 DIAGNOSIS — Z0000 Encounter for general adult medical examination without abnormal findings: Secondary | ICD-10-CM

## 2011-11-02 DIAGNOSIS — Z8619 Personal history of other infectious and parasitic diseases: Secondary | ICD-10-CM

## 2011-11-02 DIAGNOSIS — A6000 Herpesviral infection of urogenital system, unspecified: Secondary | ICD-10-CM | POA: Insufficient documentation

## 2011-11-02 LAB — HIV ANTIBODY (ROUTINE TESTING W REFLEX): HIV: NONREACTIVE

## 2011-11-02 MED ORDER — VALACYCLOVIR HCL 1 G PO TABS: 1000.0000 mg | ORAL_TABLET | Freq: Two times a day (BID) | ORAL | Status: DC

## 2011-11-02 NOTE — Progress Notes (Signed)
  Subjective:    Patient ID: Edwin Garcia, male    DOB: 19-Sep-1980, 31 y.o.   MRN: [id number]  HPI  31 year old patient who presents with a chief complaint of a rash in the groin area. He does have a history of genital  herpes and he does have a small ulcer on the glans penis consistent with a herpetic lesion. He also has other areas of dermatitis in the pubic region. He was treated for syphilis approximately 3 months ago. He states since that time he has had only one sexual encounter with barrier protection. He is requesting a followup STD screening    Review of Systems  Skin: Positive for rash.       Objective:   Physical Exam  Constitutional: He appears well-developed and well-nourished. No distress.  Genitourinary:       A small 2-3 mm ulcer noted on the glans penis. Patient feels this is similar to his typical herpetic lesions. He also has a few scattered papules in the pubic region more consistent with a resolving folliculitis. No inguinal adenopathy          Assessment & Plan:   Genital herpes History of syphilis  Will refill his Valtrex.  We'll recheck an HIV screen.  Counseling for barrier protection given

## 2011-11-02 NOTE — Patient Instructions (Addendum)
Call or return to clinic prn if these symptoms worsen or fail to improve as anticipated.

## 2011-11-03 NOTE — Progress Notes (Signed)
Quick Note:  Spoke with pt - informed of test results ______

## 2011-11-12 ENCOUNTER — Ambulatory Visit (INDEPENDENT_AMBULATORY_CARE_PROVIDER_SITE_OTHER): Payer: Managed Care, Other (non HMO) | Admitting: Family Medicine

## 2011-11-12 VITALS — BP 108/80 | HR 78 | Temp 98.1°F | Resp 16 | Ht 65.5 in | Wt 187.2 lb

## 2011-11-12 DIAGNOSIS — Z0000 Encounter for general adult medical examination without abnormal findings: Secondary | ICD-10-CM

## 2011-11-12 DIAGNOSIS — Z13220 Encounter for screening for lipoid disorders: Secondary | ICD-10-CM

## 2011-11-12 LAB — POCT CBC
Granulocyte percent: 64.1 % (ref 37–80)
HCT, POC: 52.8 % (ref 43.5–53.7)
Hemoglobin: 16.5 g/dL (ref 14.1–18.1)
Lymph, poc: 2.2 (ref 0.6–3.4)
MCH, POC: 29.6 pg (ref 27–31.2)
MCHC: 31.3 g/dL — AB (ref 31.8–35.4)
MCV: 94.7 fL (ref 80–97)
MID (cbc): 0.6 (ref 0–0.9)
MPV: 8.3 fL (ref 0–99.8)
POC Granulocyte: 5 (ref 2–6.9)
POC LYMPH PERCENT: 28.8 % (ref 10–50)
POC MID %: 7.1 % (ref 0–12)
Platelet Count, POC: 348 10*3/uL (ref 142–424)
RBC: 5.58 M/uL (ref 4.69–6.13)
RDW, POC: 15.6 %
WBC: 7.8 10*3/uL (ref 4.6–10.2)

## 2011-11-12 LAB — COMPREHENSIVE METABOLIC PANEL WITH GFR
ALT: 17 U/L (ref 0–53)
AST: 17 U/L (ref 0–37)
Albumin: 4.4 g/dL (ref 3.5–5.2)
Alkaline Phosphatase: 79 U/L (ref 39–117)
Potassium: 4.3 meq/L (ref 3.5–5.3)
Sodium: 138 meq/L (ref 135–145)
Total Bilirubin: 0.7 mg/dL (ref 0.3–1.2)
Total Protein: 7.5 g/dL (ref 6.0–8.3)

## 2011-11-12 LAB — LIPID PANEL
Cholesterol: 198 mg/dL (ref 0–200)
HDL: 57 mg/dL (ref >39)
LDL Cholesterol: 126 mg/dL — ABNORMAL HIGH (ref 0–99)
Total CHOL/HDL Ratio: 3.5 Ratio
Triglycerides: 75 mg/dL (ref <150)
VLDL: 15 mg/dL (ref 0–40)

## 2011-11-12 LAB — COMPREHENSIVE METABOLIC PANEL
BUN: 8 mg/dL (ref 6–23)
CO2: 29 mEq/L (ref 19–32)
Calcium: 9.9 mg/dL (ref 8.4–10.5)
Chloride: 101 mEq/L (ref 96–112)
Creat: 0.86 mg/dL (ref 0.50–1.35)
Glucose, Bld: 79 mg/dL (ref 70–99)

## 2011-11-12 NOTE — Progress Notes (Signed)
[facility]:  Office Visit  Chief Complaint:  Chief Complaint  Patient presents with  . Annual Exam    INSURANCE    HPI: Edwin Garcia is a 31 y.o. male who complains of  CPE. No complaints.   Past Medical History  Diagnosis Date  . APHTHOUS ULCERS 05/30/2009  . CHALAZION, LEFT 05/30/2009  . DEPRESSION 11/01/2008  . Eating disorder   . Panic attack   . Syphilis   . Allergy    Past Surgical History  Procedure Date  . Tonsillectomy     REMOVED AT 31 YRS OLD  . Ankle surgery 1999    RIGHT   History   Social History  . Marital Status: Single    Spouse Name: N/A    Number of Children: N/A  . Years of Education: N/A   Occupational History  . SECURITY [employer]   Social History Main Topics  . Smoking status: Never Smoker   . Smokeless tobacco: None  . Alcohol Use: Yes     ALL DRINKS  . Drug Use: No  . Sexually Active: Yes    Birth Control/ Protection: Condom     NUMBER OF SEX PARTNERS IN 12 MOS - 6, Homosexual male   Other Topics Concern  . None   Social History Narrative   EXERCISE AT G[employer] - CARDIO ONCE/WEEK/1 HOUR   Family History  Problem Relation Age of Onset  . Alcohol abuse Neg Hx     family  . Arthritis Neg Hx     family  . Cancer Neg Hx     family hx - breast ,lung,colon,prostate ca  . Depression Neg Hx     family  . Hypertension Maternal Grandmother 73  . Diabetes Maternal Grandfather 70    TYPE II  . Hypertension Maternal Grandfather 45   Allergies  Allergen Reactions  . Cephalexin Nausea Only    headache   Prior to Admission medications   Medication Sig Start Date End Date Taking? Authorizing Provider  valACYclovir (VALTREX) 1000 MG tablet Take 1 tablet (1,000 mg total) by mouth 2 (two) times daily. 11/02/11  Yes Gordy Savers, MD  ondansetron (ZOFRAN ODT) 8 MG disintegrating tablet Take 1 tablet (8 mg total) by mouth every 8 (eight) hours as needed for nausea. 10/01/11   Kristian Covey, MD      ROS: The patient denies fevers, chills, night sweats, unintentional weight loss, chest pain, palpitations, wheezing, dyspnea on exertion, nausea, vomiting, abdominal pain, dysuria, hematuria, melena, numbness, weakness, or tingling.   All other systems have been reviewed and were otherwise negative with the exception of those mentioned in the HPI and as above.    PHYSICAL EXAM: Filed Vitals:   11/12/11 1425  BP: 108/80  Pulse: 78  Temp: 98.1 F (36.7 C)  Resp: 16   Filed Vitals:   11/12/11 1425  Height: 5' 5.5" (1.664 m)  Weight: 187 lb 3.2 oz (84.913 kg)   Body mass index is 30.68 kg/(m^2).  General: Alert, no acute distress HEENT:  Normocephalic, atraumatic, oropharynx patent. EOMI, PERRLA, no exudates, fundoscopic exam nl Cardiovascular:  Regular rate and rhythm, no rubs murmurs or gallops.  No Carotid bruits, radial pulse intact. No pedal edema.  Respiratory: Clear to auscultation bilaterally.  No wheezes, rales, or rhonchi.  No cyanosis, no use of accessory musculature GI: No organomegaly, abdomen is soft and non-tender, positive bowel sounds.  No masses. Skin: No rashes. Neurologic: Facial musculature symmetric. Psychiatric:  Patient is appropriate throughout our interaction. Lymphatic: No cervical lymphadenopathy Musculoskeletal: Gait intact. GU- normal testicles, no dc, no rashes   LABS: Results for orders placed in visit on 11/02/11  HIV ANTIBODY (ROUTINE TESTING)      Component Value Range   HIV NON REACTIVE  NON REACTIVE     EKG/XRAY:   Primary read interpreted by Dr. Conley Rolls at [facility]   ASSESSMENT/PLAN: Encounter Diagnoses  Name Primary?  . Routine general medical examination at a health care facility Yes  . Screening for hyperlipidemia    Patient doing well. He is here for well visit for work.  Patient declined STD testing since recently done Anticipatory guidance for annual exam given and considering his h.o STDs need to be extra careful with condom  use.  Labs pending    Rockne Coons, DO 11/12/2011 3:04 PM

## 2011-11-15 ENCOUNTER — Telehealth: Payer: Self-pay | Admitting: Radiology

## 2011-11-15 NOTE — Telephone Encounter (Signed)
Patients form is completed and faxed to [facility]. He needs to be advised his LDL is elevated, but HDL is good.

## 2011-11-28 ENCOUNTER — Encounter: Payer: Self-pay | Admitting: Family Medicine

## 2012-01-10 ENCOUNTER — Encounter: Payer: Self-pay | Admitting: Family Medicine

## 2012-01-10 ENCOUNTER — Ambulatory Visit (INDEPENDENT_AMBULATORY_CARE_PROVIDER_SITE_OTHER): Payer: Managed Care, Other (non HMO) | Admitting: Family Medicine

## 2012-01-10 VITALS — BP 110/80 | Temp 98.4°F | Wt 190.0 lb

## 2012-01-10 DIAGNOSIS — Z113 Encounter for screening for infections with a predominantly sexual mode of transmission: Secondary | ICD-10-CM

## 2012-01-10 NOTE — Progress Notes (Signed)
  Subjective:    Patient ID: Edwin Garcia, male    DOB: 1980/07/20, 31 y.o.   MRN: [id number]  HPI Patient here requesting screening for STD. He had multiple sexual partners previously and recently had different partner with unprotected intercourse. He's had occasional whitish discharge from penis but no purulent secretions. No burning with urination. No fevers or chills. He has a few abrasion type areas on his penis but no blisters. No lymphadenopathy. Past history of positive syphilis-treated with IM penicillin.   Review of Systems  Constitutional: Negative for fever and chills.  Genitourinary: Positive for discharge. Negative for dysuria and frequency.  Hematological: Negative for adenopathy.       Objective:   Physical Exam  Constitutional: He appears well-developed and well-nourished.  Cardiovascular: Normal rate and regular rhythm.   Genitourinary:       Patient has a couple of linear type abrasions distal shaft of penis. Otherwise no skin lesions.          Assessment & Plan:  STD screening. Patient is counseled regarding STD prevention. Check HIV. Check RPR. Screening GC and Chlamydia with urine probe

## 2012-01-10 NOTE — Patient Instructions (Addendum)
Sexually Transmitted Disease Sexually transmitted disease (STD) refers to any infection that is passed from person to person during sexual activity. This may happen by way of saliva, semen, blood, vaginal mucus, or urine. Common STDs include:  Gonorrhea.  Chlamydia.  Syphilis.  HIV/AIDS.  Genital herpes.  Hepatitis B and C.  Trichomonas.  Human papillomavirus (HPV).  Pubic lice. CAUSES  An STD may be spread by bacteria, virus, or parasite. A person can get an STD by:  Sexual intercourse with an infected person.  Sharing sex toys with an infected person.  Sharing needles with an infected person.  Having intimate contact with the genitals, mouth, or rectal areas of an infected person. SYMPTOMS   Consider Lotrimin for penile rash. Some people may not have any symptoms, but they can still pass the infection to others. Different STDs have different symptoms. Symptoms include:  Painful or bloody urination.  Pain in the pelvis, abdomen, vagina, anus, throat, or eyes.  Skin rash, itching, irritation, growths, or sores (lesions). These usually occur in the genital or anal area.  Abnormal vaginal discharge.  Penile discharge in men.  Soft, flesh-colored skin growths in the genital or anal area.  Fever.  Pain or bleeding during sexual intercourse.  Swollen glands in the groin area.  Yellow skin and eyes (jaundice). This is seen with hepatitis. DIAGNOSIS  To make a diagnosis, your caregiver may:  Take a medical history.  Perform a physical exam.  Take a specimen (culture) to be examined.  Examine a sample of discharge under a microscope.  Perform blood tests.  Perform a Pap test, if this applies.  Perform a colposcopy.  Perform a laparoscopy. TREATMENT   Chlamydia, gonorrhea, trichomonas, and syphilis can be cured with antibiotic medicine.  Genital herpes, hepatitis, and HIV can be treated, but not cured, with prescribed medicines. The medicines will  lessen the symptoms.  Genital warts from HPV can be treated with medicine or by freezing, burning (electrocautery), or surgery. Warts may come back.  HPV is a virus and cannot be cured with medicine or surgery.However, abnormal areas may be followed very closely by your caregiver and may be removed from the cervix, vagina, or vulva through office procedures or surgery. If your diagnosis is confirmed, your recent sexual partners need treatment. This is true even if they are symptom-free or have a negative culture or evaluation. They should not have sex until their caregiver says it is okay. HOME CARE INSTRUCTIONS  All sexual partners should be informed, tested, and treated for all STDs.  Take your antibiotics as directed. Finish them even if you start to feel better.  Only take over-the-counter or prescription medicines for pain, discomfort, or fever as directed by your caregiver.  Rest.  Eat a balanced diet and drink enough fluids to keep your urine clear or pale yellow.  Do not have sex until treatment is completed and you have followed up with your caregiver. STDs should be checked after treatment.  Keep all follow-up appointments, Pap tests, and blood tests as directed by your caregiver.  Only use latex condoms and water-soluble lubricants during sexual activity. Do not use petroleum jelly or oils.  Avoid alcohol and illegal drugs.  Get vaccinated for HPV and hepatitis. If you have not received these vaccines in the past, talk to your caregiver about whether one or both might be right for you.  Avoid risky sex practices that can break the skin. The only way to avoid getting an STD is to avoid  all sexual activity.Latex condoms and dental dams (for oral sex) will help lessen the risk of getting an STD, but will not completely eliminate the risk. SEEK MEDICAL CARE IF:   You have a fever.  You have any new or worsening symptoms. Document Released: 03/27/2002 Document Revised:  03/29/2011 Document Reviewed: 04/03/2010 [facility] Patient Information 2013 [place], [place].

## 2012-01-11 NOTE — Progress Notes (Signed)
Quick Note:  Pt informed on cell VM ______ 

## 2012-02-03 ENCOUNTER — Ambulatory Visit: Payer: Managed Care, Other (non HMO) | Admitting: Family Medicine

## 2012-05-18 ENCOUNTER — Encounter: Payer: Self-pay | Admitting: Family Medicine

## 2012-05-18 ENCOUNTER — Ambulatory Visit (INDEPENDENT_AMBULATORY_CARE_PROVIDER_SITE_OTHER): Payer: BC Managed Care – PPO | Admitting: Family Medicine

## 2012-05-18 VITALS — BP 110/70 | Temp 98.4°F

## 2012-05-18 DIAGNOSIS — S8990XA Unspecified injury of unspecified lower leg, initial encounter: Secondary | ICD-10-CM

## 2012-05-18 DIAGNOSIS — S99929A Unspecified injury of unspecified foot, initial encounter: Secondary | ICD-10-CM

## 2012-05-18 DIAGNOSIS — S99911A Unspecified injury of right ankle, initial encounter: Secondary | ICD-10-CM

## 2012-05-18 MED ORDER — HYDROCODONE-ACETAMINOPHEN 5-325MG PREPACK (~~LOC~~: ORAL_TABLET | ORAL | Status: DC

## 2012-05-18 NOTE — Patient Instructions (Addendum)
Ankle Sprain  An ankle sprain is an injury to the strong, fibrous tissues (ligaments) that hold the bones of your ankle joint together.   CAUSES  An ankle sprain is usually caused by a fall or by twisting your ankle. Ankle sprains most commonly occur when you step on the outer edge of your foot, and your ankle turns inward. People who participate in sports are more prone to these types of injuries.   SYMPTOMS    Pain in your ankle. The pain may be present at rest or only when you are trying to stand or walk.   Swelling.   Bruising. Bruising may develop immediately or within 1 to 2 days after your injury.   Difficulty standing or walking, particularly when turning corners or changing directions.  DIAGNOSIS   Your caregiver will ask you details about your injury and perform a physical exam of your ankle to determine if you have an ankle sprain. During the physical exam, your caregiver will press on and apply pressure to specific areas of your foot and ankle. Your caregiver will try to move your ankle in certain ways. An X-ray exam may be done to be sure a bone was not broken or a ligament did not separate from one of the bones in your ankle (avulsion fracture).   TREATMENT   Certain types of braces can help stabilize your ankle. Your caregiver can make a recommendation for this. Your caregiver may recommend the use of medicine for pain. If your sprain is severe, your caregiver may refer you to a surgeon who helps to restore function to parts of your skeletal system (orthopedist) or a physical therapist.  HOME CARE INSTRUCTIONS    Apply ice to your injury for 1 to 2 days or as directed by your caregiver. Applying ice helps to reduce inflammation and pain.   Put ice in a plastic bag.   Place a towel between your skin and the bag.   Leave the ice on for 15 to 20 minutes at a time, every 2 hours while you are awake.   Only take over-the-counter or prescription medicines for pain, discomfort, or fever as directed  by your caregiver.   Keep your injured leg elevated, when possible, to lessen swelling.   If your caregiver recommends crutches, use them as instructed. Gradually put weight on the affected ankle. Continue to use crutches or a cane until you can walk without feeling pain in your ankle.   If you have a plaster splint, wear the splint as directed by your caregiver. Do not rest it on anything harder than a pillow for the first 24 hours. Do not put weight on it. Do not get it wet. You may take it off to take a shower or bath.   You may have been given an elastic bandage to wear around your ankle to provide support. If the elastic bandage is too tight (you have numbness or tingling in your foot or your foot becomes cold and blue), adjust the bandage to make it comfortable.   If you have an air splint, you may blow more air into it or let air out to make it more comfortable. You may take your splint off at night and before taking a shower or bath.   Wiggle your toes in the splint several times per day to decrease swelling.  SEEK MEDICAL CARE IF:    You have an increase in bruising, swelling, or pain.   Your toes feel extremely cold   or you lose feeling in your foot.   Your pain is not relieved with medicine.  SEEK IMMEDIATE MEDICAL CARE IF:   Your toes are numb or blue.   You have severe pain.  MAKE SURE YOU:    Understand these instructions.   Will watch your condition.   Will get help right away if you are not doing well or get worse.  Document Released: 01/04/2005 Document Revised: 03/29/2011 Document Reviewed: 01/16/2011  [employer] Patient Information 2013 [employer].

## 2012-05-18 NOTE — Progress Notes (Signed)
  Subjective:    Patient ID: Edwin Garcia, male    DOB: [birth year], 32 y.o.   MRN: [id number]  HPI Acute visit. Right ankle pain. Patient reports acute injury yesterday morning. Going down stairs and missed a step. Thinks he had an inversion type injury. Remote history of open reduction internal fixation right ankle from complicated fracture several years ago. Patient has appointment with orthopedist but not until Monday He applied ice. Was able to ambulate after his injury. He has medial and lateral pain but worse lateral No instability with ambulation. Pain is moderate  Past Medical History  Diagnosis Date  . APHTHOUS ULCERS 05/30/2009  . CHALAZION, LEFT 05/30/2009  . DEPRESSION 11/01/2008  . Eating disorder   . Panic attack   . Syphilis   . Allergy    Past Surgical History  Procedure Laterality Date  . Tonsillectomy      REMOVED AT 32 YRS OLD  . Ankle surgery  1999    RIGHT    reports that he has never smoked. He does not have any smokeless tobacco history on file. He reports that  drinks alcohol. He reports that he does not use illicit drugs. family history includes Diabetes (age of onset: 82) in his maternal grandfather; Hypertension (age of onset: 18) in his maternal grandmother; and Hypertension (age of onset: 19) in his maternal grandfather.  There is no history of Alcohol abuse, and Arthritis, and Cancer, and Depression, . Allergies  Allergen Reactions  . Cephalexin Nausea Only    headache      Review of Systems  Neurological: Negative for syncope, weakness and numbness.       Objective:   Physical Exam  Constitutional: He appears well-developed and well-nourished.  Cardiovascular: Normal rate and regular rhythm.   Musculoskeletal:  Right ankle reveals some obvious edema lateral compartment greater than medial. No ecchymosis. No warmth. Pain with inversion and eversion. Distal fibular tenderness. Minimal tenderness distal tibia. Achilles is nontender and  intact. He is able to dorsiflex and plantar flex without much difficulty other than pain          Assessment & Plan:  Acute right ankle injury in a patient with prior history of open reduction internal fixation. Obtain x-rays. Ice and elevation. Ace wrap for compression. He is encouraged to followup with orthopedist as scheduled

## 2012-06-06 ENCOUNTER — Telehealth: Payer: Self-pay | Admitting: Family Medicine

## 2012-06-06 NOTE — Telephone Encounter (Signed)
Pt did not get the ankle films ordered 05/18/12. Please advise - contact pt or cancel order as appropriate.

## 2012-06-07 NOTE — Telephone Encounter (Signed)
I called pt, he had x-ray done at [facility], ortho

## 2012-06-26 ENCOUNTER — Emergency Department (HOSPITAL_COMMUNITY)
Admission: EM | Admit: 2012-06-26 | Discharge: 2012-06-26 | Disposition: A | Discharge status: Home / Self Care | Payer: BC Managed Care – PPO | Attending: Emergency Medicine | Admitting: Emergency Medicine

## 2012-06-26 ENCOUNTER — Encounter (HOSPITAL_COMMUNITY): Payer: Self-pay | Admitting: Emergency Medicine

## 2012-06-26 DIAGNOSIS — Z23 Encounter for immunization: Secondary | ICD-10-CM | POA: Insufficient documentation

## 2012-06-26 DIAGNOSIS — S61209A Unspecified open wound of unspecified finger without damage to nail, initial encounter: Secondary | ICD-10-CM | POA: Insufficient documentation

## 2012-06-26 DIAGNOSIS — S01309A Unspecified open wound of unspecified ear, initial encounter: Secondary | ICD-10-CM | POA: Insufficient documentation

## 2012-06-26 DIAGNOSIS — S01312A Laceration without foreign body of left ear, initial encounter: Secondary | ICD-10-CM

## 2012-06-26 MED ORDER — OXYCODONE-ACETAMINOPHEN 5-325 MG PO TABS: 1.0000 | ORAL_TABLET | Freq: Four times a day (QID) | ORAL | Status: DC | PRN

## 2012-06-26 MED ORDER — BUPIVACAINE HCL (PF) 0.5 % IJ SOLN
50.0000 mL | Freq: Once | INTRAMUSCULAR | Status: AC
  Administered 2012-06-26: 50 mL

## 2012-06-26 MED ORDER — LIDOCAINE HCL 2 % IJ SOLN
INTRAMUSCULAR | Status: AC
  Administered 2012-06-26: 23:00:00
  Filled 2012-06-26: qty 20

## 2012-06-26 MED ORDER — TETANUS-DIPHTH-ACELL PERTUSSIS 5-2.5-18.5 LF-MCG/0.5 IM SUSP
0.5000 mL | Freq: Once | INTRAMUSCULAR | Status: AC
  Administered 2012-06-26: 0.5 mL via INTRAMUSCULAR
  Filled 2012-06-26: qty 0.5

## 2012-06-26 NOTE — ED Notes (Signed)
Patient brought in by PD and placed in triage room.
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
## 2012-07-04 NOTE — ED Provider Notes (Signed)
Medical screening examination/treatment/procedure(s) were conducted as a shared visit with non-physician practitioner(s) and myself.  I personally evaluated the patient during the encounter.  Pt with complex laceration to L ear, but felt amenable to closure w/o ENT consultation. Continued wound care and return precautions discussed.   Raeford Razor, MD 07/04/12 [phone]

## 2012-07-05 ENCOUNTER — Ambulatory Visit: Payer: BC Managed Care – PPO | Admitting: Family Medicine

## 2012-07-05 ENCOUNTER — Ambulatory Visit: Payer: BC Managed Care – PPO | Admitting: Family

## 2012-07-06 ENCOUNTER — Ambulatory Visit (INDEPENDENT_AMBULATORY_CARE_PROVIDER_SITE_OTHER): Payer: BC Managed Care – PPO | Admitting: Family Medicine

## 2012-07-06 ENCOUNTER — Encounter: Payer: Self-pay | Admitting: Family Medicine

## 2012-07-06 VITALS — Temp 98.2°F | Wt 162.0 lb

## 2012-07-06 DIAGNOSIS — S01309A Unspecified open wound of unspecified ear, initial encounter: Secondary | ICD-10-CM

## 2012-07-06 DIAGNOSIS — S01319A Laceration without foreign body of unspecified ear, initial encounter: Secondary | ICD-10-CM

## 2012-07-06 DIAGNOSIS — M25569 Pain in unspecified knee: Secondary | ICD-10-CM

## 2012-07-06 MED ORDER — OXYCODONE-ACETAMINOPHEN 5-325 MG PO TABS: 1.0000 | ORAL_TABLET | Freq: Four times a day (QID) | ORAL | Status: DC | PRN
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
## 2012-10-20 ENCOUNTER — Emergency Department (HOSPITAL_COMMUNITY)
Admission: EM | Admit: 2012-10-20 | Discharge: 2012-10-20 | Disposition: A | Discharge status: Home / Self Care | Payer: BC Managed Care – PPO | Attending: Emergency Medicine | Admitting: Emergency Medicine

## 2012-10-20 ENCOUNTER — Encounter (HOSPITAL_COMMUNITY): Payer: Self-pay | Admitting: *Deleted

## 2012-10-20 DIAGNOSIS — Z8669 Personal history of other diseases of the nervous system and sense organs: Secondary | ICD-10-CM | POA: Insufficient documentation

## 2012-10-20 DIAGNOSIS — A6000 Herpesviral infection of urogenital system, unspecified: Secondary | ICD-10-CM | POA: Insufficient documentation

## 2012-10-20 DIAGNOSIS — Z8719 Personal history of other diseases of the digestive system: Secondary | ICD-10-CM | POA: Insufficient documentation

## 2012-10-20 DIAGNOSIS — N489 Disorder of penis, unspecified: Secondary | ICD-10-CM | POA: Insufficient documentation

## 2012-10-20 DIAGNOSIS — Z8659 Personal history of other mental and behavioral disorders: Secondary | ICD-10-CM | POA: Insufficient documentation

## 2012-10-20 LAB — RAPID HIV SCREEN (WH-MAU): Rapid HIV Screen: NONREACTIVE

## 2012-10-20 MED ORDER — VALACYCLOVIR HCL 1 G PO TABS: 1000.0000 mg | ORAL_TABLET | Freq: Every day | ORAL | Status: AC

## 2012-10-20 NOTE — ED Provider Notes (Signed)
CSN: [id number]     Arrival date & time 10/20/12  1923 History   First MD Initiated Contact with Patient 10/20/12 2027     Chief Complaint  Patient presents with  . Rash   (Consider location/radiation/quality/duration/timing/severity/associated sxs/prior Treatment) HPI  Patient presents emergency department with a rash on the base of the head of his penis.  Patient, states, that he has had a history of herpes simplex in the past in the same general area.  Patient, states he does have some pain with this rash.  Patient denies penile discharge, nausea, vomiting, or abdominal pain Past Medical History  Diagnosis Date  . APHTHOUS ULCERS 05/30/2009  . CHALAZION, LEFT 05/30/2009  . DEPRESSION 11/01/2008  . Eating disorder   . Panic attack   . Syphilis   . Allergy    Past Surgical History  Procedure Laterality Date  . Tonsillectomy      REMOVED AT 32 YRS OLD  . Ankle surgery  1999    RIGHT   Family History  Problem Relation Age of Onset  . Alcohol abuse Neg Hx     family  . Arthritis Neg Hx     family  . Cancer Neg Hx     family hx - breast ,lung,colon,prostate ca  . Depression Neg Hx     family  . Hypertension Maternal Grandmother 44  . Diabetes Maternal Grandfather 70    TYPE II  . Hypertension Maternal Grandfather 45   History  Substance Use Topics  . Smoking status: Never Smoker   . Smokeless tobacco: Not on file  . Alcohol Use: Yes     Comment: ALL DRINKS    Review of Systems All other systems negative except as documented in the HPI. All pertinent positives and negatives as reviewed in the HPI. Allergies  Cephalexin  Home Medications   Current Outpatient Rx  Name  Route  Sig  Dispense  Refill  . valACYclovir (VALTREX) 1000 MG tablet   Oral   Take 1 tablet (1,000 mg total) by mouth 2 (two) times daily.   24 tablet   2    BP 128/84  Pulse 80  Temp(Src) 98.4 F (36.9 C) (Oral)  Resp 18  SpO2 100% Physical Exam  Nursing note and vitals  reviewed. Constitutional: He appears well-developed and well-nourished. No distress.  HENT:  Head: Normocephalic and atraumatic.  Cardiovascular: Normal rate, regular rhythm and normal heart sounds.   Pulmonary/Chest: Effort normal and breath sounds normal.  Genitourinary: Testes normal.    Penile erythema and penile tenderness present. No discharge found.  Lymphadenopathy:       Right: Inguinal adenopathy present.       Left: Inguinal adenopathy present.  Skin: Skin is warm and dry. No erythema.    ED Course  Procedures (including critical care time) Patient requests HIV testing.  Patient does not use very safe sex practices.  She is counseled on this and advised to followup with his primary care Dr. patient has been tested for HIV in the past, which was negative  MDM      Carlyle Dolly, PA-C 10/20/12 2204

## 2012-10-20 NOTE — ED Notes (Signed)
Patient complains of  Discomfort in groin area on Monday. He began to have itching and burning on today and reports no drainage.

## 2012-10-21 NOTE — ED Provider Notes (Signed)
Medical screening examination/treatment/procedure(s) were performed by non-physician practitioner and as supervising physician I was immediately available for consultation/collaboration.  Hurman Horn, MD 10/21/12 [phone]

## 2012-11-22 ENCOUNTER — Other Ambulatory Visit: Payer: Self-pay

## 2012-11-22 MED ORDER — VALACYCLOVIR HCL 1 G PO TABS: 1000.0000 mg | ORAL_TABLET | Freq: Two times a day (BID) | ORAL | Status: DC

## 2013-03-28 ENCOUNTER — Emergency Department (HOSPITAL_COMMUNITY)
Admission: EM | Admit: 2013-03-28 | Discharge: 2013-03-29 | Disposition: A | Discharge status: Home / Self Care | Payer: BC Managed Care – PPO | Attending: Emergency Medicine | Admitting: Emergency Medicine

## 2013-03-28 ENCOUNTER — Encounter (HOSPITAL_COMMUNITY): Payer: Self-pay | Admitting: Emergency Medicine

## 2013-03-28 DIAGNOSIS — Z8659 Personal history of other mental and behavioral disorders: Secondary | ICD-10-CM | POA: Insufficient documentation

## 2013-03-28 DIAGNOSIS — Z8619 Personal history of other infectious and parasitic diseases: Secondary | ICD-10-CM | POA: Insufficient documentation

## 2013-03-28 DIAGNOSIS — Z8719 Personal history of other diseases of the digestive system: Secondary | ICD-10-CM | POA: Insufficient documentation

## 2013-03-28 DIAGNOSIS — Z8669 Personal history of other diseases of the nervous system and sense organs: Secondary | ICD-10-CM | POA: Insufficient documentation

## 2013-03-28 DIAGNOSIS — A6000 Herpesviral infection of urogenital system, unspecified: Secondary | ICD-10-CM | POA: Insufficient documentation

## 2013-03-28 NOTE — ED Notes (Signed)
The pt has a rash in the genital area for 2 days.  He reprots that he has herpes but this rash does not appear to be the same type

## 2013-03-29 MED ORDER — ACYCLOVIR 200 MG PO CAPS: 200.0000 mg | ORAL_CAPSULE | Freq: Every day | ORAL | Status: DC

## 2013-03-29 NOTE — Discharge Instructions (Signed)
Follow up with the Health department for further routine testing of STDs. Take Acyclovir 5 times per day for 5 days total. Acyclovir is on the 4 dollar list at [facility].    Genital Herpes Genital herpes is a sexually transmitted disease. This means that it is a disease passed by having sex with an infected person. There is no cure for genital herpes. The time between attacks can be months to years. The virus may live in a person but produce no problems (symptoms). This infection can be passed to a baby as it travels down the birth canal (vagina). In a newborn, this can cause central nervous system damage, eye damage, or even death. The virus that causes genital herpes is usually HSV-2 virus. The virus that causes oral herpes is usually HSV-1. The diagnosis (learning what is wrong) is made through culture results. SYMPTOMS  Usually symptoms of pain and itching begin a few days to a week after contact. It first appears as small blisters that progress to small painful ulcers which then scab over and heal after several days. It affects the outer genitalia, birth canal, cervix, penis, anal area, buttocks, and thighs. HOME CARE INSTRUCTIONS   Keep ulcerated areas dry and clean.  Take medications as directed. Antiviral medications can speed up healing. They will not prevent recurrences or cure this infection. These medications can also be taken for suppression if there are frequent recurrences.  While the infection is active, it is contagious. Avoid all sexual contact during active infections.  Condoms may help prevent spread of the herpes virus.  Practice safe sex.  Wash your hands thoroughly after touching the genital area.  Avoid touching your eyes after touching your genital area.  Inform your caregiver if you have had genital herpes and become pregnant. It is your responsibility to insure a safe outcome for your baby in this pregnancy.  Only take over-the-counter or prescription medicines for  pain, discomfort, or fever as directed by your caregiver. SEEK MEDICAL CARE IF:   You have a recurrence of this infection.  You do not respond to medications and are not improving.  You have new sources of pain or discharge which have changed from the original infection.  You have an oral temperature above 102 F (38.9 C).  You develop abdominal pain.  You develop eye pain or signs of eye infection. Document Released: 01/02/2000 Document Revised: 03/29/2011 Document Reviewed: 01/22/2009 [employer] [facility] Patient Information 2014 [place], [place].

## 2013-03-29 NOTE — ED Provider Notes (Signed)
CSN: [id number]     Arrival date & time 03/28/13  2234 History   First MD Initiated Contact with Patient 03/29/13 0002     Chief Complaint  Patient presents with  . Rash     (Consider location/radiation/quality/duration/timing/severity/associated sxs/prior Treatment) HPI 33 yo homosexual man with a hx of Genital herpes presents with rash on penis that started a couple days ago. Patient admits to new sexual contact approximately 1 week ago. Patient admits to anal intercourse. Patient states he no longer has insurance and is out of his Valtrex. Patient states he would like something cheaper if possible due to his current health insurance predicament. Patient Denies any fevers/chills, abdominal pain, N/V, HA, dizziness, bodyaches, penile discharge, or any urinary sxs. Patient states it has been a while since he has had an outbreak.  Past Medical History  Diagnosis Date  . APHTHOUS ULCERS 05/30/2009  . CHALAZION, LEFT 05/30/2009  . DEPRESSION 11/01/2008  . Eating disorder   . Panic attack   . Syphilis   . Allergy    Past Surgical History  Procedure Laterality Date  . Tonsillectomy      REMOVED AT 33 YRS OLD  . Ankle surgery  1999    RIGHT   Family History  Problem Relation Age of Onset  . Alcohol abuse Neg Hx     family  . Arthritis Neg Hx     family  . Cancer Neg Hx     family hx - breast ,lung,colon,prostate ca  . Depression Neg Hx     family  . Hypertension Maternal Grandmother [id number]  . Diabetes Maternal Grandfather 70    TYPE II  . Hypertension Maternal Grandfather 45   History  Substance Use Topics  . Smoking status: Never Smoker   . Smokeless tobacco: Not on file  . Alcohol Use: Yes     Comment: ALL DRINKS    Review of Systems  All other systems reviewed and are negative.      Allergies  Cephalexin  Home Medications   Current Outpatient Rx  Name  Route  Sig  Dispense  Refill  . acyclovir (ZOVIRAX) 200 MG capsule   Oral   Take 1 capsule (200 mg total) by  mouth 5 (five) times daily.   30 capsule   0   . valACYclovir (VALTREX) 1000 MG tablet   Oral   Take 1 tablet (1,000 mg total) by mouth 2 (two) times daily.   24 tablet   2    BP 126/89  Pulse 80  Temp(Src) 98 F (36.7 C) (Oral)  Resp 20  Ht 5\' 7"  (1.702 m)  Wt 159 lb (72.122 kg)  BMI 24.90 kg/m2  SpO2 98% Physical Exam  Nursing note and vitals reviewed. Constitutional: He is oriented to person, place, and time. He appears well-developed and well-nourished. No distress.  HENT:  Head: Normocephalic and atraumatic.  Eyes: Conjunctivae and EOM are normal. Pupils are equal, round, and reactive to light.  Neck: Normal range of motion. Neck supple. No tracheal deviation present.  Cardiovascular: Normal rate.   Pulmonary/Chest: Effort normal. No respiratory distress.  Abdominal: Soft. There is no tenderness.  Genitourinary: Testes normal.    Circumcised. No penile erythema. No discharge found.  Small clusters of vesicular lesions with clear serous drainage noted from one of the lesions. No evidence of ulceration. Lesions tender to touch. No penile discharge noted. Lesions appear localized to dorsal aspect of penis just behind the corona.     Musculoskeletal:  Normal range of motion. He exhibits no edema.  Neurological: He is alert and oriented to person, place, and time.  Skin: Skin is warm and dry. He is not diaphoretic.  Psychiatric: He has a normal mood and affect. His behavior is normal.    ED Course  Procedures (including critical care time) Labs Review Labs Reviewed - No data to display Imaging Review No results found.   EKG Interpretation None      MDM   Final diagnoses:  Genital herpes    Patient afebrile with normal VS.  Patient also requests HIV testing. Patient advised to follow up with Health Department for additional testing for HIV, as he would likely need to follow up with Health department if he has any abnormal results. Patient confirms  understanding.  Plan to treat patient for current herpes outbreak. Will start on patient on acyclovir 200 mg 5 times per day x 5 days, as this dosage is on the $4 list at [facility].  Advised patient to return to ED if his symptoms worsen or do not resolve with tx.   Meds given in ED:  Medications - No data to display  Discharge Medication List as of 03/29/2013 12:59 AM    START taking these medications   Details  acyclovir (ZOVIRAX) 200 MG capsule Take 1 capsule (200 mg total) by mouth 5 (five) times daily., Starting 03/29/2013, Until Discontinued, Print           Rudene Anda, [place] 03/29/13 1222

## 2013-04-02 NOTE — ED Provider Notes (Signed)
Medical screening examination/treatment/procedure(s) were performed by non-physician practitioner and as supervising physician I was immediately available for consultation/collaboration.   EKG Interpretation None        Donley Harland M Isabele Lollar, MD 04/02/13 0709 

## 2013-05-16 ENCOUNTER — Telehealth: Payer: Self-pay | Admitting: Family Medicine

## 2013-05-16 MED ORDER — ACYCLOVIR 200 MG PO CAPS: 200.0000 mg | ORAL_CAPSULE | Freq: Every day | ORAL | Status: DC

## 2013-05-16 NOTE — Telephone Encounter (Signed)
Call back attempted at 10am on 4-29, vmail left on [phone].

## 2013-05-16 NOTE — Telephone Encounter (Signed)
Spoke with patient and RX sent to pharmacy

## 2013-08-18 ENCOUNTER — Emergency Department (HOSPITAL_COMMUNITY)
Admission: EM | Admit: 2013-08-18 | Discharge: 2013-08-18 | Disposition: A | Discharge status: Home / Self Care | Payer: BC Managed Care – PPO | Attending: Emergency Medicine | Admitting: Emergency Medicine

## 2013-08-18 ENCOUNTER — Encounter (HOSPITAL_COMMUNITY): Payer: Self-pay | Admitting: Emergency Medicine

## 2013-08-18 DIAGNOSIS — Z8659 Personal history of other mental and behavioral disorders: Secondary | ICD-10-CM | POA: Insufficient documentation

## 2013-08-18 DIAGNOSIS — J069 Acute upper respiratory infection, unspecified: Secondary | ICD-10-CM | POA: Insufficient documentation

## 2013-08-18 DIAGNOSIS — Z8719 Personal history of other diseases of the digestive system: Secondary | ICD-10-CM | POA: Insufficient documentation

## 2013-08-18 DIAGNOSIS — Z8669 Personal history of other diseases of the nervous system and sense organs: Secondary | ICD-10-CM | POA: Insufficient documentation

## 2013-08-18 DIAGNOSIS — Z8619 Personal history of other infectious and parasitic diseases: Secondary | ICD-10-CM | POA: Insufficient documentation

## 2013-08-18 DIAGNOSIS — Z79899 Other long term (current) drug therapy: Secondary | ICD-10-CM | POA: Insufficient documentation

## 2013-08-18 DIAGNOSIS — Z202 Contact with and (suspected) exposure to infections with a predominantly sexual mode of transmission: Secondary | ICD-10-CM

## 2013-08-18 LAB — HIV ANTIBODY (ROUTINE TESTING W REFLEX): HIV: NONREACTIVE

## 2013-08-18 LAB — RPR

## 2013-08-18 MED ORDER — OXYMETAZOLINE HCL 0.05 % NA SOLN: 1.0000 | Freq: Two times a day (BID) | NASAL | Status: DC

## 2013-08-18 NOTE — ED Provider Notes (Signed)
CSN: [id number]     Arrival date & time 08/18/13  1242 History  This chart was scribed for non-physician practitioner working with Shanna Cisco, MD, by Modena Jansky, ED Scribe. This patient was seen in room TR10C/TR10C and the patient's care was started at 1:44 PM.   Chief Complaint  Patient presents with  . Exposure to STD  . URI   HPI HPI Comments: Edwin Garcia is a 33 y.o. male who presents to the Emergency Department complaining of a possible exposure to an STD. He states that for 2 weeks he has been having diarrhea, rhinorrhea, nasal congestion, and cough. He states that 3 weeks ago he had an episode of "infidelity" with his partner. He states that he wanted to make sure that he did not catch any illness from him. He denies any sick contacts. He also denies any fever, abdominal pain, emesis, dysuria, urinary frequency or urgency, penile discharge, or testicular pain.   Past Medical History  Diagnosis Date  . APHTHOUS ULCERS 05/30/2009  . CHALAZION, LEFT 05/30/2009  . DEPRESSION 11/01/2008  . Eating disorder   . Panic attack   . Syphilis   . Allergy    Past Surgical History  Procedure Laterality Date  . Tonsillectomy      REMOVED AT 33 YRS OLD  . Ankle surgery  1999    RIGHT   Family History  Problem Relation Age of Onset  . Alcohol abuse Neg Hx     family  . Arthritis Neg Hx     family  . Cancer Neg Hx     family hx - breast ,lung,colon,prostate ca  . Depression Neg Hx     family  . Hypertension Maternal Grandmother 66  . Diabetes Maternal Grandfather 70    TYPE II  . Hypertension Maternal Grandfather 45   History  Substance Use Topics  . Smoking status: Never Smoker   . Smokeless tobacco: Not on file  . Alcohol Use: Yes     Comment: ALL DRINKS    Review of Systems  Constitutional: Negative for fever.  Gastrointestinal: Negative for vomiting and abdominal pain.  Genitourinary: Negative for discharge and testicular pain.  All other systems reviewed and  are negative.   Allergies  Cephalexin  Home Medications   Prior to Admission medications   Medication Sig Start Date End Date Taking? Authorizing Provider  acyclovir (ZOVIRAX) 200 MG capsule Take 1 capsule (200 mg total) by mouth 5 (five) times daily. 05/16/13   Kristian Covey, MD  valACYclovir (VALTREX) 1000 MG tablet Take 1 tablet (1,000 mg total) by mouth 2 (two) times daily. 11/22/12   Kristian Covey, MD   BP 133/76  Pulse 90  Temp(Src) 98.1 F (36.7 C) (Oral)  Resp 18  SpO2 100% Physical Exam  Nursing note and vitals reviewed. Constitutional: He appears well-developed and well-nourished. No distress.  HENT:  Head: Normocephalic and atraumatic.  Mouth/Throat: Oropharynx is clear and moist.  Discharge and some edema of turbinates. Mild swelling of front and maxillary sinuses.   Neck: Neck supple.  Cardiovascular: Normal rate and regular rhythm.   Pulmonary/Chest: Effort normal and breath sounds normal. No respiratory distress. He has no wheezes. He has no rales.  Abdominal: Soft. He exhibits no distension and no mass. There is no tenderness. There is no rebound and no guarding.  Lymphadenopathy:    He has no cervical adenopathy.  Neurological: He is alert. He exhibits normal muscle tone.  Skin: He is not  diaphoretic.    ED Course  Procedures (including critical care time) DIAGNOSTIC STUDIES: Oxygen Saturation is 100% on RA, normal by my interpretation.    COORDINATION OF CARE: 1:48 PM- Pt advised of plan for treatment which includes labs and pt agrees. 2:11 PM- Pt declined GC Chlamydia testing.   Labs Review Labs Reviewed  GC/CHLAMYDIA PROBE AMP  RPR  HIV ANTIBODY (ROUTINE TESTING)    Imaging Review No results found.   EKG Interpretation None      MDM   Final diagnoses:  URI (upper respiratory infection)  Potential exposure to STD   Afebrile nontoxic patient with mild URI symptoms and concern for STD exposure.  Pt declines GC/Chlam testing.   D/C home with afrin for nasal congestion.  Pt aware HIV and RPR are pending.  Discussed findings, treatment, and follow up  with patient.  Pt given return precautions.  Pt verbalizes understanding and agrees with plan.       I personally performed the services described in this documentation, which was scribed in my presence. The recorded information has been reviewed and is accurate.     AllardtEmily Mihir Flanigan, PA-C 08/18/13 1557

## 2013-08-18 NOTE — ED Notes (Signed)
Pt presents to department for evaluation of URI symptoms, pt states runny nose and sinus congestion. Also requesting STD check. Pt is alert and oriented x4.

## 2013-08-18 NOTE — Discharge Instructions (Signed)
Read the information below.  Use the prescribed medication as directed.  Please discuss all new medications with your pharmacist.  You may return to the Emergency Department at any time for worsening condition or any new symptoms that concern you.  If you develop high fevers that do not resolve with tylenol or ibuprofen, you have difficulty swallowing or breathing, or you are unable to tolerate fluids by mouth, return to the ER for a recheck.     Upper Respiratory Infection, Adult An upper respiratory infection (URI) is also sometimes known as the common cold. The upper respiratory tract includes the nose, sinuses, throat, trachea, and bronchi. Bronchi are the airways leading to the lungs. Most people improve within 1 week, but symptoms can last up to 2 weeks. A residual cough may last even longer.  CAUSES Many different viruses can infect the tissues lining the upper respiratory tract. The tissues become irritated and inflamed and often become very moist. Mucus production is also common. A cold is contagious. You can easily spread the virus to others by oral contact. This includes kissing, sharing a glass, coughing, or sneezing. Touching your mouth or nose and then touching a surface, which is then touched by another person, can also spread the virus. SYMPTOMS  Symptoms typically develop 1 to 3 days after you come in contact with a cold virus. Symptoms vary from person to person. They may include:  Runny nose.  Sneezing.  Nasal congestion.  Sinus irritation.  Sore throat.  Loss of voice (laryngitis).  Cough.  Fatigue.  Muscle aches.  Loss of appetite.  Headache.  Low-grade fever. DIAGNOSIS  You might diagnose your own cold based on familiar symptoms, since most people get a cold 2 to 3 times a year. Your caregiver can confirm this based on your exam. Most importantly, your caregiver can check that your symptoms are not due to another disease such as strep throat, sinusitis,  pneumonia, asthma, or epiglottitis. Blood tests, throat tests, and X-rays are not necessary to diagnose a common cold, but they may sometimes be helpful in excluding other more serious diseases. Your caregiver will decide if any further tests are required. RISKS AND COMPLICATIONS  You may be at risk for a more severe case of the common cold if you smoke cigarettes, have chronic heart disease (such as heart failure) or lung disease (such as asthma), or if you have a weakened immune system. The very young and very old are also at risk for more serious infections. Bacterial sinusitis, middle ear infections, and bacterial pneumonia can complicate the common cold. The common cold can worsen asthma and chronic obstructive pulmonary disease (COPD). Sometimes, these complications can require emergency medical care and may be life-threatening. PREVENTION  The best way to protect against getting a cold is to practice good hygiene. Avoid oral or hand contact with people with cold symptoms. Wash your hands often if contact occurs. There is no clear evidence that vitamin C, vitamin E, echinacea, or exercise reduces the chance of developing a cold. However, it is always recommended to get plenty of rest and practice good nutrition. TREATMENT  Treatment is directed at relieving symptoms. There is no cure. Antibiotics are not effective, because the infection is caused by a virus, not by bacteria. Treatment may include:  Increased fluid intake. Sports drinks offer valuable electrolytes, sugars, and fluids.  Breathing heated mist or steam (vaporizer or shower).  Eating chicken soup or other clear broths, and maintaining good nutrition.  Getting plenty of  rest.  Using gargles or lozenges for comfort.  Controlling fevers with ibuprofen or acetaminophen as directed by your caregiver.  Increasing usage of your inhaler if you have asthma. Zinc gel and zinc lozenges, taken in the first 24 hours of the common cold, can  shorten the duration and lessen the severity of symptoms. Pain medicines may help with fever, muscle aches, and throat pain. A variety of non-prescription medicines are available to treat congestion and runny nose. Your caregiver can make recommendations and may suggest nasal or lung inhalers for other symptoms.  HOME CARE INSTRUCTIONS   Only take over-the-counter or prescription medicines for pain, discomfort, or fever as directed by your caregiver.  Use a warm mist humidifier or inhale steam from a shower to increase air moisture. This may keep secretions moist and make it easier to breathe.  Drink enough water and fluids to keep your urine clear or pale yellow.  Rest as needed.  Return to work when your temperature has returned to normal or as your caregiver advises. You may need to stay home longer to avoid infecting others. You can also use a face mask and careful hand washing to prevent spread of the virus. SEEK MEDICAL CARE IF:   After the first few days, you feel you are getting worse rather than better.  You need your caregiver's advice about medicines to control symptoms.  You develop chills, worsening shortness of breath, or brown or red sputum. These may be signs of pneumonia.  You develop yellow or brown nasal discharge or pain in the face, especially when you bend forward. These may be signs of sinusitis.  You develop a fever, swollen neck glands, pain with swallowing, or white areas in the back of your throat. These may be signs of strep throat. SEEK IMMEDIATE MEDICAL CARE IF:   You have a fever.  You develop severe or persistent headache, ear pain, sinus pain, or chest pain.  You develop wheezing, a prolonged cough, cough up blood, or have a change in your usual mucus (if you have chronic lung disease).  You develop sore muscles or a stiff neck. Document Released: 06/30/2000 Document Revised: 03/29/2011 Document Reviewed: 04/11/2013 [facility] Patient Information 2015  [employer] [place]. This information is not intended to replace advice given to you by your health care provider. Make sure you discuss any questions you have with your health care provider.  Sexually Transmitted Disease A sexually transmitted disease (STD) is a disease or infection that may be passed (transmitted) from person to person, usually during sexual activity. This may happen by way of saliva, semen, blood, vaginal mucus, or urine. Common STDs include:   Gonorrhea.   Chlamydia.   Syphilis.   HIV and AIDS.   Genital herpes.   Hepatitis B and C.   Trichomonas.   Human papillomavirus (HPV).   Pubic lice.   Scabies.  Mites.  Bacterial vaginosis. WHAT ARE CAUSES OF STDs? An STD may be caused by bacteria, a virus, or parasites. STDs are often transmitted during sexual activity if one person is infected. However, they may also be transmitted through nonsexual means. STDs may be transmitted after:   Sexual intercourse with an infected person.   Sharing sex toys with an infected person.   Sharing needles with an infected person or using unclean piercing or tattoo needles.  Having intimate contact with the genitals, mouth, or rectal areas of an infected person.   Exposure to infected fluids during birth. WHAT ARE THE SIGNS AND SYMPTOMS  OF STDs? Different STDs have different symptoms. Some people may not have any symptoms. If symptoms are present, they may include:   Painful or bloody urination.   Pain in the pelvis, abdomen, vagina, anus, throat, or eyes.   A skin rash, itching, or irritation.  Growths, ulcerations, blisters, or sores in the genital and anal areas.  Abnormal vaginal discharge with or without bad odor.   Penile discharge in men.   Fever.   Pain or bleeding during sexual intercourse.   Swollen glands in the groin area.   Yellow skin and eyes (jaundice). This is seen with hepatitis.   Swollen  testicles.  Infertility.  Sores and blisters in the mouth. HOW ARE STDs DIAGNOSED? To make a diagnosis, your health care provider may:   Take a medical history.   Perform a physical exam.   Take a sample of any discharge to examine.  Swab the throat, cervix, opening to the penis, rectum, or vagina for testing.  Test a sample of your first morning urine.   Perform blood tests.   Perform a Pap test, if this applies.   Perform a colposcopy.   Perform a laparoscopy.  HOW ARE STDs TREATED? Treatment depends on the STD. Some STDs may be treated but not cured.   Chlamydia, gonorrhea, trichomonas, and syphilis can be cured with antibiotic medicine.   Genital herpes, hepatitis, and HIV can be treated, but not cured, with prescribed medicines. The medicines lessen symptoms.   Genital warts from HPV can be treated with medicine or by freezing, burning (electrocautery), or surgery. Warts may come back.   HPV cannot be cured with medicine or surgery. However, abnormal areas may be removed from the cervix, vagina, or vulva.   If your diagnosis is confirmed, your recent sexual partners need treatment. This is true even if they are symptom-free or have a negative culture or evaluation. They should not have sex until their health care providers say it is okay. HOW CAN I REDUCE MY RISK OF GETTING AN STD? Take these steps to reduce your risk of getting an STD:  Use latex condoms, dental dams, and water-soluble lubricants during sexual activity. Do not use petroleum jelly or oils.  Avoid having multiple sex partners.  Do not have sex with someone who has other sex partners.  Do not have sex with anyone you do not know or who is at high risk for an STD.  Avoid risky sex practices that can break your skin.  Do not have sex if you have open sores on your mouth or skin.  Avoid drinking too much alcohol or taking illegal drugs. Alcohol and drugs can affect your judgment and put  you in a vulnerable position.  Avoid engaging in oral and anal sex acts.  Get vaccinated for HPV and hepatitis. If you have not received these vaccines in the past, talk to your health care provider about whether one or both might be right for you.   If you are at risk of being infected with HIV, it is recommended that you take a prescription medicine daily to prevent HIV infection. This is called pre-exposure prophylaxis (PrEP). You are considered at risk if:  You are a man who has sex with other men (MSM).  You are a heterosexual man or woman and are sexually active with more than one partner.  You take drugs by injection.  You are sexually active with a partner who has HIV.  Talk with your health care provider about  whether you are at high risk of being infected with HIV. If you choose to begin PrEP, you should first be tested for HIV. You should then be tested every 3 months for as long as you are taking PrEP.  WHAT SHOULD I DO IF I THINK I HAVE AN STD?  See your health care provider.   Tell your sexual partner(s). They should be tested and treated for any STDs.  Do not have sex until your health care provider says it is okay. WHEN SHOULD I GET IMMEDIATE MEDICAL CARE? Contact your health care provider right away if:   You have severe abdominal pain.  You are a man and notice swelling or pain in your testicles.  You are a woman and notice swelling or pain in your vagina. Document Released: 03/27/2002 Document Revised: 01/09/2013 Document Reviewed: 07/25/2012 [facility] Patient Information 2015 [place], [place]. This information is not intended to replace advice given to you by your health care provider. Make sure you discuss any questions you have with your health care provider.

## 2013-08-18 NOTE — ED Notes (Signed)
Declined W/C at D/C and was escorted to lobby by RN. 

## 2013-08-18 NOTE — ED Notes (Signed)
PT requested to be tested for HIV. Blood drawn.

## 2013-08-19 NOTE — ED Provider Notes (Signed)
Medical screening examination/treatment/procedure(s) were performed by non-physician practitioner and as supervising physician I was immediately available for consultation/collaboration.  Megan E Docherty, MD 08/19/13 0716 

## 2014-02-21 ENCOUNTER — Telehealth: Payer: Self-pay | Admitting: Family Medicine

## 2014-02-21 NOTE — Telephone Encounter (Signed)
Pt needs appt

## 2014-02-21 NOTE — Telephone Encounter (Signed)
Patient need re-fill on valACYclovir (VALTREX) 1000 MG [facility] [place], [place] - [address]

## 2014-08-22 ENCOUNTER — Other Ambulatory Visit: Payer: Self-pay | Admitting: Family Medicine

## 2014-08-31 ENCOUNTER — Other Ambulatory Visit: Payer: Self-pay | Admitting: Family Medicine

## 2014-09-02 ENCOUNTER — Telehealth: Payer: Self-pay

## 2014-09-02 NOTE — Telephone Encounter (Signed)
Can you please call patient for a CPE or followup. Pt has not been seen since 05/18/12

## 2014-09-03 ENCOUNTER — Emergency Department (HOSPITAL_COMMUNITY): Payer: Self-pay

## 2014-09-03 ENCOUNTER — Encounter (HOSPITAL_COMMUNITY): Payer: Self-pay | Admitting: Emergency Medicine

## 2014-09-03 ENCOUNTER — Emergency Department (HOSPITAL_COMMUNITY)
Admission: EM | Admit: 2014-09-03 | Discharge: 2014-09-03 | Disposition: A | Discharge status: Home / Self Care | Payer: Self-pay | Attending: Emergency Medicine | Admitting: Emergency Medicine

## 2014-09-03 DIAGNOSIS — N390 Urinary tract infection, site not specified: Secondary | ICD-10-CM

## 2014-09-03 DIAGNOSIS — R634 Abnormal weight loss: Secondary | ICD-10-CM | POA: Insufficient documentation

## 2014-09-03 DIAGNOSIS — Z8659 Personal history of other mental and behavioral disorders: Secondary | ICD-10-CM | POA: Insufficient documentation

## 2014-09-03 DIAGNOSIS — Z8619 Personal history of other infectious and parasitic diseases: Secondary | ICD-10-CM | POA: Insufficient documentation

## 2014-09-03 DIAGNOSIS — Z8719 Personal history of other diseases of the digestive system: Secondary | ICD-10-CM | POA: Insufficient documentation

## 2014-09-03 DIAGNOSIS — R000 Tachycardia, unspecified: Secondary | ICD-10-CM | POA: Insufficient documentation

## 2014-09-03 DIAGNOSIS — Z79899 Other long term (current) drug therapy: Secondary | ICD-10-CM | POA: Insufficient documentation

## 2014-09-03 DIAGNOSIS — Z8669 Personal history of other diseases of the nervous system and sense organs: Secondary | ICD-10-CM | POA: Insufficient documentation

## 2014-09-03 LAB — COMPREHENSIVE METABOLIC PANEL
ALK PHOS: 94 U/L (ref 38–126)
ALT: 35 U/L (ref 17–63)
AST: 44 U/L — AB (ref 15–41)
Albumin: 3.6 g/dL (ref 3.5–5.0)
Anion gap: 10 (ref 5–15)
BILIRUBIN TOTAL: 1.4 mg/dL — AB (ref 0.3–1.2)
CALCIUM: 9.4 mg/dL (ref 8.9–10.3)
CHLORIDE: 102 mmol/L (ref 101–111)
CO2: 26 mmol/L (ref 22–32)
CREATININE: 0.94 mg/dL (ref 0.61–1.24)
GFR calc Af Amer: >60 mL/min (ref >60)
Glucose, Bld: 106 mg/dL — ABNORMAL HIGH (ref 65–99)
Potassium: 3.4 mmol/L — ABNORMAL LOW (ref 3.5–5.1)
Sodium: 138 mmol/L (ref 135–145)
TOTAL PROTEIN: 6.6 g/dL (ref 6.5–8.1)

## 2014-09-03 LAB — CBC WITH DIFFERENTIAL/PLATELET
Basophils Absolute: 0 10*3/uL (ref 0.0–0.1)
Basophils Relative: 0 % (ref 0–1)
Eosinophils Absolute: 0 10*3/uL (ref 0.0–0.7)
Eosinophils Relative: 0 % (ref 0–5)
HCT: 40.3 % (ref 39.0–52.0)
Hemoglobin: 13.9 g/dL (ref 13.0–17.0)
Lymphocytes Relative: 9 % — ABNORMAL LOW (ref 12–46)
Lymphs Abs: 1.2 10*3/uL (ref 0.7–4.0)
MCH: 30.9 pg (ref 26.0–34.0)
MCHC: 34.5 g/dL (ref 30.0–36.0)
MCV: 89.6 fL (ref 78.0–100.0)
Monocytes Absolute: 1.4 10*3/uL — ABNORMAL HIGH (ref 0.1–1.0)
Monocytes Relative: 10 % (ref 3–12)
Neutro Abs: 11.3 10*3/uL — ABNORMAL HIGH (ref 1.7–7.7)
Neutrophils Relative %: 81 % — ABNORMAL HIGH (ref 43–77)
Platelets: 288 10*3/uL (ref 150–400)
RBC: 4.5 MIL/uL (ref 4.22–5.81)
RDW: 14.2 % (ref 11.5–15.5)
WBC: 13.8 10*3/uL — ABNORMAL HIGH (ref 4.0–10.5)

## 2014-09-03 LAB — I-STAT CHEM 8, ED
BUN: 5 mg/dL — ABNORMAL LOW (ref 6–20)
Calcium, Ion: 1.19 mmol/L (ref 1.12–1.23)
Chloride: 99 mmol/L — ABNORMAL LOW (ref 101–111)
Creatinine, Ser: 0.9 mg/dL (ref 0.61–1.24)
Glucose, Bld: 96 mg/dL (ref 65–99)
HCT: 45 % (ref 39.0–52.0)
Hemoglobin: 15.3 g/dL (ref 13.0–17.0)
Potassium: 3.3 mmol/L — ABNORMAL LOW (ref 3.5–5.1)
Sodium: 137 mmol/L (ref 135–145)
TCO2: 27 mmol/L (ref 0–100)

## 2014-09-03 LAB — URINALYSIS, ROUTINE W REFLEX MICROSCOPIC
Glucose, UA: 100 mg/dL — AB
Ketones, ur: 15 mg/dL — AB
Nitrite: POSITIVE — AB
Protein, ur: 100 mg/dL — AB
Specific Gravity, Urine: 1.03 (ref 1.005–1.030)
Urobilinogen, UA: 2 mg/dL — ABNORMAL HIGH (ref 0.0–1.0)
pH: 6 (ref 5.0–8.0)

## 2014-09-03 LAB — URINE MICROSCOPIC-ADD ON

## 2014-09-03 MED ORDER — ACETAMINOPHEN 325 MG PO TABS
650.0000 mg | ORAL_TABLET | Freq: Once | ORAL | Status: AC
  Administered 2014-09-03: 650 mg via ORAL
  Filled 2014-09-03: qty 2

## 2014-09-03 MED ORDER — PENICILLIN G BENZATHINE 1200000 UNIT/2ML IM SUSP
2.4000 10*6.[IU] | Freq: Once | INTRAMUSCULAR | Status: AC
  Administered 2014-09-03: 2.4 10*6.[IU] via INTRAMUSCULAR
  Filled 2014-09-03: qty 4

## 2014-09-03 MED ORDER — VALACYCLOVIR HCL 1 G PO TABS: 1000.0000 mg | ORAL_TABLET | Freq: Two times a day (BID) | ORAL | Status: DC

## 2014-09-03 MED ORDER — DOXYCYCLINE HYCLATE 100 MG PO CAPS: 100.0000 mg | ORAL_CAPSULE | Freq: Two times a day (BID) | ORAL | Status: DC

## 2014-09-03 MED ORDER — SODIUM CHLORIDE 0.9 % IV BOLUS (SEPSIS)
1000.0000 mL | Freq: Once | INTRAVENOUS | Status: AC
  Administered 2014-09-03: 1000 mL via INTRAVENOUS

## 2014-09-03 MED ORDER — METRONIDAZOLE 500 MG PO TABS
2000.0000 mg | ORAL_TABLET | Freq: Once | ORAL | Status: AC
  Administered 2014-09-03: 2000 mg via ORAL
  Filled 2014-09-03: qty 4

## 2014-09-03 NOTE — ED Provider Notes (Signed)
CSN: [id number]     Arrival date & time 09/03/14  0910 History   First MD Initiated Contact with Patient 09/03/14 [phone]     Chief Complaint  Patient presents with  . Fever     (Consider location/radiation/quality/duration/timing/severity/associated sxs/prior Treatment) HPI   34 year old homosexual male with history of syphilis, depression, eating disorder presenting for evaluation of fever. Patient states 5 days ago he developed fever as high as 102, which he takes [facility] powders with some improvement. Since then he has had chills, body aches, night sweats and mild weight loss. Fever initially improved but now spiked again. He endorsed mild headache but no neck stiffness. He also notice that his urine is dark than usual without urinary discomfort. He had a rash on the shaft of his penis for the past week which he thought that it is related to herpes simplex and he has been taking Valtrex. The rash/lesion is with minimal tenderness and no blister formation. He is sexually active with 4 separate partners within the past 6 months. One of his partner recently has exposed to another HIV positive person therefore patient is concerned for possible HIV. Otherwise he denies having neck Stiffness, chest pain, shortness of breath, productive cough, abdominal pain, vomiting or diarrhea or hematuria.  Past Medical History  Diagnosis Date  . APHTHOUS ULCERS 05/30/2009  . CHALAZION, LEFT 05/30/2009  . DEPRESSION 11/01/2008  . Eating disorder   . Panic attack   . Syphilis   . Allergy    Past Surgical History  Procedure Laterality Date  . Tonsillectomy      REMOVED AT 34 YRS OLD  . Ankle surgery  1999    RIGHT   Family History  Problem Relation Age of Onset  . Alcohol abuse Neg Hx     family  . Arthritis Neg Hx     family  . Cancer Neg Hx     family hx - breast ,lung,colon,prostate ca  . Depression Neg Hx     family  . Hypertension Maternal Grandmother 5  . Diabetes Maternal Grandfather 70    TYPE  II  . Hypertension Maternal Grandfather 16   Social History  Substance Use Topics  . Smoking status: Never Smoker   . Smokeless tobacco: None  . Alcohol Use: Yes     Comment: ALL DRINKS    Review of Systems  All other systems reviewed and are negative.     Allergies  Cephalexin  Home Medications   Prior to Admission medications   Medication Sig Start Date End Date Taking? Authorizing Provider  acyclovir (ZOVIRAX) 200 MG capsule Take 1 capsule (200 mg total) by mouth 5 (five) times daily. 05/16/13   Kristian Covey, MD  oxymetazoline (AFRIN NASAL SPRAY) 0.05 % nasal spray Place 1 spray into both nostrils 2 (two) times daily. 08/18/13   Trixie Dredge, PA-C  valACYclovir (VALTREX) 1000 MG tablet Take 1 tablet (1,000 mg total) by mouth 2 (two) times daily. 11/22/12   Kristian Covey, MD   BP 128/79 mmHg  Pulse 110  Temp(Src) 99.7 F (37.6 C) (Oral)  Resp 20  SpO2 100% Physical Exam  Constitutional: He appears well-developed and well-nourished. No distress.  African-American male, nontoxic in appearance ambulate without difficulty.  HENT:  Head: Atraumatic.  Eyes: Conjunctivae are normal.  Neck: Neck supple.  No nuchal rigidity  Cardiovascular: Regular rhythm.   Mildly tachycardic without murmur rubs or gallops  Pulmonary/Chest: Effort normal and breath sounds normal.  Abdominal: Soft. There  is no tenderness.  Genitourinary:  Chaperone present during exam. Uncircumcised penis. An ulcerated lesion noted to the mid anterior shaft, measuring less than 1 cm in diameter with some scab formation. No surrounding erythema. No inguinal lymphadenopathy or inguinal hernia noted. Testicle with normal lie, no scrotal swelling.  Neurological: He is alert.  Skin: No rash noted.  Psychiatric: He has a normal mood and affect.  Nursing note and vitals reviewed.   ED Course  Procedures (including critical care time)  Patient here complaining of fever, and Concern for HIV since He Has  Recently Been Exposed to Another Sexual Partner That Has Sex with an HIV Positive person.  STDs screening will be provided. Patient has a ulcerated lesion to the saphenous penis. He has prior history of syphilis. Although this could be related to herpes, doing to increased risk of recurrent syphilis, patient will receive 2.4 million units of penicillin in the ED as treatment for syphilis.  12:45 PM Pt also felt his sxs is similar to prior trichomonas infection.  Currently labs are unavailable due to technical difficulty.  Since pt is at high risk, we agree to give pt flagyl 2g PO in ER for coverage of trichomonas infection.  He also request refill of his acyclovir.  UA with signs of infection, suspect UTI as pt has many bacteria and nitrite positive. He's also having fever and dark urine.  Will prescribe doxy as it will also cover for STD.  The remainder of the cultures are sent away.  Pt otherwise stable for discharge.  Return precaution discussed.    Labs Review Labs Reviewed - No data to display  Imaging Review Dg Chest 2 View  09/03/2014   CLINICAL DATA:  Fever and central chest pain.  EXAM: CHEST  2 VIEW  COMPARISON:  11/03/2004  FINDINGS: The heart size and mediastinal contours are within normal limits. Both lungs are clear. The visualized skeletal structures are unremarkable.  IMPRESSION: No active cardiopulmonary disease.   Electronically Signed   By: Richarda Overlie M.D.   On: 09/03/2014 10:17   I, Grissel Tyrell, personally reviewed and evaluated these images and lab results as part of my medical decision-making.   EKG Interpretation None      MDM   Final diagnoses:  UTI (lower urinary tract infection)    BP 112/68 mmHg  Pulse 101  Temp(Src) 99.4 F (37.4 C) (Oral)  Resp 16  SpO2 99%  I have reviewed nursing notes and vital signs. I personally viewed the imaging tests through PACS system and agrees with radiologist's intepretation I reviewed available ER/hospitalization records  through the EMR      Fayrene Helper, PA-C 09/03/14 1248  Linwood Dibbles, MD 09/03/14 1252

## 2014-09-03 NOTE — ED Notes (Addendum)
Pt reports fever as high as 102 and chills since Friday. Pt denies cough, reports a headache. Pt also states he wanted to get tested for HIV because he has had some weight loss over the past few weeks and is concerned since he also has a fever.

## 2014-09-03 NOTE — Telephone Encounter (Signed)
Lm on vm to cb °

## 2014-09-03 NOTE — ED Notes (Signed)
PA Tran at bedside. 

## 2014-09-03 NOTE — Discharge Instructions (Signed)
Urinary Tract Infection °Urinary tract infections (UTIs) can develop anywhere along your urinary tract. Your urinary tract is your body's drainage system for removing wastes and extra water. Your urinary tract includes two kidneys, two ureters, a bladder, and a urethra. Your kidneys are a pair of bean-shaped organs. Each kidney is about the size of your fist. They are located below your ribs, one on each side of your spine. °CAUSES °Infections are caused by microbes, which are microscopic organisms, including fungi, viruses, and bacteria. These organisms are so small that they can only be seen through a microscope. Bacteria are the microbes that most commonly cause UTIs. °SYMPTOMS  °Symptoms of UTIs may vary by age and gender of the patient and by the location of the infection. Symptoms in young women typically include a frequent and intense urge to urinate and a painful, burning feeling in the bladder or urethra during urination. Older women and men are more likely to be tired, shaky, and weak and have muscle aches and abdominal pain. A fever may mean the infection is in your kidneys. Other symptoms of a kidney infection include pain in your back or sides below the ribs, nausea, and vomiting. °DIAGNOSIS °To diagnose a UTI, your caregiver will ask you about your symptoms. Your caregiver also will ask to provide a urine sample. The urine sample will be tested for bacteria and white blood cells. White blood cells are made by your body to help fight infection. °TREATMENT  °Typically, UTIs can be treated with medication. Because most UTIs are caused by a bacterial infection, they usually can be treated with the use of antibiotics. The choice of antibiotic and length of treatment depend on your symptoms and the type of bacteria causing your infection. °HOME CARE INSTRUCTIONS °· If you were prescribed antibiotics, take them exactly as your caregiver instructs you. Finish the medication even if you feel better after you  have only taken some of the medication. °· Drink enough water and fluids to keep your urine clear or pale yellow. °· Avoid caffeine, tea, and carbonated beverages. They tend to irritate your bladder. °· Empty your bladder often. Avoid holding urine for long periods of time. °· Empty your bladder before and after sexual intercourse. °· After a bowel movement, women should cleanse from front to back. Use each tissue only once. °SEEK MEDICAL CARE IF:  °· You have back pain. °· You develop a fever. °· Your symptoms do not begin to resolve within 3 days. °SEEK IMMEDIATE MEDICAL CARE IF:  °· You have severe back pain or lower abdominal pain. °· You develop chills. °· You have nausea or vomiting. °· You have continued burning or discomfort with urination. °MAKE SURE YOU:  °· Understand these instructions. °· Will watch your condition. °· Will get help right away if you are not doing well or get worse. °Document Released: 10/14/2004 Document Revised: 07/06/2011 Document Reviewed: 02/12/2011 °[employer]® Patient Information ©2015 [employer]. This information is not intended to replace advice given to you by your health care provider. Make sure you discuss any questions you have with your health care provider. ° °Sexually Transmitted Disease °A sexually transmitted disease (STD) is a disease or infection often passed to another person during sex. However, STDs can be passed through nonsexual ways. An STD can be passed through: °· Spit (saliva). °· Semen. °· Blood. °· Mucus from the vagina. °· Pee (urine). °HOW CAN I LESSEN MY CHANCES OF GETTING AN STD? °· Use: °¨ Latex condoms. °¨ Water-soluble   lubricants with condoms. Do not use petroleum jelly or oils.  Dental dams. These are small pieces of latex that are used as a barrier during oral sex.  Avoid having more than one sex partner.  Do not have sex with someone who has other sex partners.  Do not have sex with anyone you do not know or who is at high risk for an  STD.  Avoid risky sex that can break your skin.  Do not have sex if you have open sores on your mouth or skin.  Avoid drinking too much alcohol or taking illegal drugs. Alcohol and drugs can affect your good judgment.  Avoid oral and anal sex acts.  Get shots (vaccines) for HPV and hepatitis.  If you are at risk of being infected with HIV, it is advised that you take a certain medicine daily to prevent HIV infection. This is called pre-exposure prophylaxis (PrEP). You may be at risk if:  You are a man who has sex with other men (MSM).  You are attracted to the opposite sex (heterosexual) and are having sex with more than one partner.  You take drugs with a needle.  You have sex with someone who has HIV.  Talk with your doctor about if you are at high risk of being infected with HIV. If you begin to take PrEP, get tested for HIV first. Get tested every 3 months for as long as you are taking PrEP. WHAT SHOULD I DO IF I THINK I HAVE AN STD?  See your doctor.  Tell your sex partner(s) that you have an STD. They should be tested and treated.  Do not have sex until your doctor says it is okay. WHEN SHOULD I GET HELP? Get help right away if:  You have bad belly (abdominal) pain.  You are a man and have puffiness (swelling) or pain in your testicles.  You are a woman and have puffiness in your vagina. Document Released: 02/12/2004 Document Revised: 01/09/2013 Document Reviewed: 06/30/2012 [facility] Patient Information 2015 [place], [place]. This information is not intended to replace advice given to you by your health care provider. Make sure you discuss any questions you have with your health care provider.

## 2014-09-03 NOTE — ED Notes (Signed)
Patient transported to X-ray 

## 2014-09-04 ENCOUNTER — Telehealth: Payer: Self-pay | Admitting: *Deleted

## 2014-09-04 LAB — RPR: RPR Ser Ql: NONREACTIVE

## 2014-09-04 LAB — GC/CHLAMYDIA PROBE AMP (~~LOC~~) NOT AT ARMC

## 2014-09-04 LAB — HIV ANTIBODY (ROUTINE TESTING W REFLEX): HIV SCREEN 4TH GENERATION: NONREACTIVE

## 2014-09-04 NOTE — Telephone Encounter (Signed)
Pharmacy called related to Rx: doxycycline (VIBRAMYCIN) 100 MG capsule.Marland KitchenMarland KitchenNCM clarified with EDP to change Rx to: generic.

## 2014-09-05 LAB — URINE CULTURE

## 2014-09-06 ENCOUNTER — Emergency Department (HOSPITAL_COMMUNITY)
Admission: EM | Admit: 2014-09-06 | Discharge: 2014-09-06 | Disposition: A | Discharge status: Home / Self Care | Payer: Self-pay | Attending: Emergency Medicine | Admitting: Emergency Medicine

## 2014-09-06 ENCOUNTER — Encounter (HOSPITAL_COMMUNITY): Payer: Self-pay

## 2014-09-06 DIAGNOSIS — Z8659 Personal history of other mental and behavioral disorders: Secondary | ICD-10-CM | POA: Insufficient documentation

## 2014-09-06 DIAGNOSIS — L03115 Cellulitis of right lower limb: Secondary | ICD-10-CM | POA: Insufficient documentation

## 2014-09-06 DIAGNOSIS — L03119 Cellulitis of unspecified part of limb: Secondary | ICD-10-CM

## 2014-09-06 LAB — BASIC METABOLIC PANEL
Anion gap: 10 (ref 5–15)
CHLORIDE: 101 mmol/L (ref 101–111)
CO2: 26 mmol/L (ref 22–32)
CREATININE: 0.79 mg/dL (ref 0.61–1.24)
Calcium: 9.1 mg/dL (ref 8.9–10.3)
GFR calc Af Amer: >60 mL/min (ref >60)
GFR calc non Af Amer: >60 mL/min (ref >60)
Glucose, Bld: 107 mg/dL — ABNORMAL HIGH (ref 65–99)
Potassium: 3.7 mmol/L (ref 3.5–5.1)
SODIUM: 137 mmol/L (ref 135–145)

## 2014-09-06 LAB — CBC
HCT: 39.1 % (ref 39.0–52.0)
HEMOGLOBIN: 13.5 g/dL (ref 13.0–17.0)
MCH: 30.5 pg (ref 26.0–34.0)
MCHC: 34.5 g/dL (ref 30.0–36.0)
MCV: 88.3 fL (ref 78.0–100.0)
Platelets: 297 10*3/uL (ref 150–400)
RBC: 4.43 MIL/uL (ref 4.22–5.81)
RDW: 13.8 % (ref 11.5–15.5)
WBC: 5.5 10*3/uL (ref 4.0–10.5)

## 2014-09-06 MED ORDER — CLINDAMYCIN HCL 300 MG PO CAPS: 300.0000 mg | ORAL_CAPSULE | Freq: Three times a day (TID) | ORAL | Status: DC

## 2014-09-06 NOTE — ED Provider Notes (Signed)
CSN: [id number]     Arrival date & time 09/06/14  4098 History   First MD Initiated Contact with Patient 09/06/14 [phone]     Chief Complaint  Patient presents with  . Rash     (Consider location/radiation/quality/duration/timing/severity/associated sxs/prior Treatment) HPI  Pt presenting with c/o bilateral lower extremity redness.  Worse on the right lower extremity.  Pt hit his right lower extremity on a table a couple of weeks ago and since that time has noted that redness on his leg has worsened.  He had fever approx 1 week ago, fever has resolved.  He was treated in the ED for presumed UTI and started on doxycycline to cover for STDs as well.  Review of urine culture from 8/16 shows no UTI.  Pt states the redness on his shins has been decreasing, but is still present.  No chills.  No vomiting or other systemic symptoms.  No swelling of legs.  No chest pain or shortness of breath.  There are no other associated systemic symptoms, there are no other alleviating or modifying factors.   Past Medical History  Diagnosis Date  . APHTHOUS ULCERS 05/30/2009  . CHALAZION, LEFT 05/30/2009  . DEPRESSION 11/01/2008  . Eating disorder   . Panic attack   . Syphilis   . Allergy    Past Surgical History  Procedure Laterality Date  . Tonsillectomy      REMOVED AT 34 YRS OLD  . Ankle surgery  1999    RIGHT   Family History  Problem Relation Age of Onset  . Alcohol abuse Neg Hx     family  . Arthritis Neg Hx     family  . Cancer Neg Hx     family hx - breast ,lung,colon,prostate ca  . Depression Neg Hx     family  . Hypertension Maternal Grandmother 57  . Diabetes Maternal Grandfather 70    TYPE II  . Hypertension Maternal Grandfather 33   Social History  Substance Use Topics  . Smoking status: Never Smoker   . Smokeless tobacco: None  . Alcohol Use: Yes     Comment: ALL DRINKS    Review of Systems  ROS reviewed and all otherwise negative except for mentioned in HPI    Allergies   Cephalexin  Home Medications   Prior to Admission medications   Medication Sig Start Date End Date Taking? Authorizing Provider  acyclovir (ZOVIRAX) 200 MG capsule Take 1 capsule (200 mg total) by mouth 5 (five) times daily. Patient taking differently: Take 200 mg by mouth See admin instructions. Fives times a day as needed for herpes. 05/16/13  Yes Kristian Covey, MD  doxycycline (VIBRAMYCIN) 100 MG capsule Take 1 capsule (100 mg total) by mouth 2 (two) times daily. One po bid x 7 days 09/03/14  Yes Fayrene Helper, PA-C  clindamycin (CLEOCIN) 300 MG capsule Take 1 capsule (300 mg total) by mouth 3 (three) times daily. 09/06/14   Jerelyn Scott, MD   BP 107/78 mmHg  Pulse 67  Temp(Src) 97.6 F (36.4 C) (Oral)  Resp 18  Ht 5\' 6"  (1.676 m)  Wt 159 lb (72.122 kg)  BMI 25.68 kg/m2  SpO2 99%  Vitals reviewed Physical Exam  Physical Examination: General appearance - alert, well appearing, and in no distress Mental status - alert, oriented to person, place, and time Eyes -no conjunctival injection, no scleral icterus Mouth - mucous membranes moist, pharynx normal without lesions Chest - clear to auscultation, no wheezes, rales  or rhonchi, symmetric air entry Heart - normal rate, regular rhythm, normal S1, S2, no murmurs, rubs, clicks or gallops Abdomen - soft, nontender, nondistended, no masses or organomegaly Neurological - alert, oriented, normal speech, Extremities - peripheral pulses normal, no pedal edema, no clubbing or cyanosis Skin - normal coloration and turgor other than erythema and warmth overlying right lower extremity surrounding superficial appearing abrasion which has healed over- area of redness is approx 6cm- patient states this is improved from initially being red up to his knee. No induration or fluctuance to suggest abscess  ED Course  Procedures (including critical care time) Labs Review Labs Reviewed  BASIC METABOLIC PANEL - Abnormal; Notable for the following:     Glucose, Bld 107 (*)    BUN <5 (*)    All other components within normal limits  CBC    Imaging Review No results found. I have personally reviewed and evaluated these images and lab results as part of my medical decision-making.   EKG Interpretation None      MDM   Final diagnoses:  Cellulitis of lower extremity, unspecified laterality    Pt presenting with cellulitis of lower extremity- has improved somewhat being that patient is on doxycycline for another diagnosis, pt has no systemic symptoms, normal white blood cell count.  Pt started on clindamycin and advised to have area rechecked in 48 hours.  Discharged with strict return precautions.  Pt agreeable with plan.    Jerelyn Scott, MD 09/06/14 1136

## 2014-09-06 NOTE — Discharge Instructions (Signed)
Return to the ED with any concerns including increased area of redness, fever/chills, vomiting and not able to keep down liquids or antibiotics, decreased level of alertness/lethargy, or any other alarming symptoms

## 2014-09-06 NOTE — ED Notes (Signed)
Pt arrived by POV, presents with rash to the right shin that started a week ago today. Reddened area is warm to the touch. Pt was seen here Tuesday for UTI and fever and was given antibiotics but states that the rash was present prior to antibiotics. Pt also has some redness to the left shin but states that it is somewhat better. Pt states that if he stands too long on his right leg it starts to hurt

## 2014-09-20 NOTE — Telephone Encounter (Signed)
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

## 2014-10-08 ENCOUNTER — Emergency Department (HOSPITAL_COMMUNITY)
Admission: EM | Admit: 2014-10-08 | Discharge: 2014-10-08 | Disposition: A | Discharge status: Home / Self Care | Payer: Self-pay | Attending: Emergency Medicine | Admitting: Emergency Medicine

## 2014-10-08 ENCOUNTER — Emergency Department (HOSPITAL_COMMUNITY): Payer: Self-pay

## 2014-10-08 ENCOUNTER — Encounter (HOSPITAL_COMMUNITY): Payer: Self-pay

## 2014-10-08 DIAGNOSIS — Z8659 Personal history of other mental and behavioral disorders: Secondary | ICD-10-CM | POA: Insufficient documentation

## 2014-10-08 DIAGNOSIS — R079 Chest pain, unspecified: Secondary | ICD-10-CM | POA: Insufficient documentation

## 2014-10-08 DIAGNOSIS — R55 Syncope and collapse: Secondary | ICD-10-CM | POA: Insufficient documentation

## 2014-10-08 DIAGNOSIS — R002 Palpitations: Secondary | ICD-10-CM | POA: Insufficient documentation

## 2014-10-08 LAB — BASIC METABOLIC PANEL
ANION GAP: 10 (ref 5–15)
BUN: 6 mg/dL (ref 6–20)
CALCIUM: 9 mg/dL (ref 8.9–10.3)
CO2: 25 mmol/L (ref 22–32)
CREATININE: 0.77 mg/dL (ref 0.61–1.24)
Chloride: 104 mmol/L (ref 101–111)
Glucose, Bld: 86 mg/dL (ref 65–99)
Potassium: 4.6 mmol/L (ref 3.5–5.1)
SODIUM: 139 mmol/L (ref 135–145)

## 2014-10-08 LAB — CBC
HCT: 41 % (ref 39.0–52.0)
HEMOGLOBIN: 13.7 g/dL (ref 13.0–17.0)
MCH: 30 pg (ref 26.0–34.0)
MCHC: 33.4 g/dL (ref 30.0–36.0)
MCV: 89.7 fL (ref 78.0–100.0)
PLATELETS: 231 10*3/uL (ref 150–400)
RBC: 4.57 MIL/uL (ref 4.22–5.81)
RDW: 13.9 % (ref 11.5–15.5)
WBC: 5.4 10*3/uL (ref 4.0–10.5)

## 2014-10-08 LAB — D-DIMER, QUANTITATIVE (NOT AT ARMC): D DIMER QUANT: 0.36 ug{FEU}/mL (ref 0.00–0.48)

## 2014-10-08 LAB — TROPONIN I: Troponin I: <0.03 ng/mL (ref <0.031)

## 2014-10-08 NOTE — ED Notes (Signed)
Family at bedside. Pt resting eating chips.

## 2014-10-08 NOTE — ED Notes (Addendum)
Pt arrived via EMS started having chest pain at work and passed out. 150/108 BP, BS 100; EMS gave 324 of aspirin, and 0.4 of nitro.

## 2014-10-08 NOTE — ED Notes (Signed)
Pt transported to xray 

## 2014-10-08 NOTE — Discharge Instructions (Signed)
Chest Pain (Nonspecific) °It is often hard to give a specific diagnosis for the cause of chest pain. There is always a chance that your pain could be related to something serious, such as a heart attack or a blood clot in the lungs. You need to follow up with your health care provider for further evaluation. °CAUSES  °· Heartburn. °· Pneumonia or bronchitis. °· Anxiety or stress. °· Inflammation around your heart (pericarditis) or lung (pleuritis or pleurisy). °· A blood clot in the lung. °· A collapsed lung (pneumothorax). It can develop suddenly on its own (spontaneous pneumothorax) or from trauma to the chest. °· Shingles infection (herpes zoster virus). °The chest wall is composed of bones, muscles, and cartilage. Any of these can be the source of the pain. °· The bones can be bruised by injury. °· The muscles or cartilage can be strained by coughing or overwork. °· The cartilage can be affected by inflammation and become sore (costochondritis). °DIAGNOSIS  °Lab tests or other studies may be needed to find the cause of your pain. Your health care provider may have you take a test called an ambulatory electrocardiogram (ECG). An ECG records your heartbeat patterns over a 24-hour period. You may also have other tests, such as: °· Transthoracic echocardiogram (TTE). During echocardiography, sound waves are used to evaluate how blood flows through your heart. °· Transesophageal echocardiogram (TEE). °· Cardiac monitoring. This allows your health care provider to monitor your heart rate and rhythm in real time. °· Holter monitor. This is a portable device that records your heartbeat and can help diagnose heart arrhythmias. It allows your health care provider to track your heart activity for several days, if needed. °· Stress tests by exercise or by giving medicine that makes the heart beat faster. °TREATMENT  °· Treatment depends on what may be causing your chest pain. Treatment may include: °· Acid blockers for  heartburn. °· Anti-inflammatory medicine. °· Pain medicine for inflammatory conditions. °· Antibiotics if an infection is present. °· You may be advised to change lifestyle habits. This includes stopping smoking and avoiding alcohol, caffeine, and chocolate. °· You may be advised to keep your head raised (elevated) when sleeping. This reduces the chance of acid going backward from your stomach into your esophagus. °Most of the time, nonspecific chest pain will improve within 2-3 days with rest and mild pain medicine.  °HOME CARE INSTRUCTIONS  °· If antibiotics were prescribed, take them as directed. Finish them even if you start to feel better. °· For the next few days, avoid physical activities that bring on chest pain. Continue physical activities as directed. °· Do not use any tobacco products, including cigarettes, chewing tobacco, or electronic cigarettes. °· Avoid drinking alcohol. °· Only take medicine as directed by your health care provider. °· Follow your health care provider's suggestions for further testing if your chest pain does not go away. °· Keep any follow-up appointments you made. If you do not go to an appointment, you could develop lasting (chronic) problems with pain. If there is any problem keeping an appointment, call to reschedule. °SEEK MEDICAL CARE IF:  °· Your chest pain does not go away, even after treatment. °· You have a rash with blisters on your chest. °· You have a fever. °SEEK IMMEDIATE MEDICAL CARE IF:  °· You have increased chest pain or pain that spreads to your arm, neck, jaw, back, or abdomen. °· You have shortness of breath. °· You have an increasing cough, or you cough   up blood.  You have severe back or abdominal pain.  You feel nauseous or vomit.  You have severe weakness.  You faint.  You have chills. This is an emergency. Do not wait to see if the pain will go away. Get medical help at once. Call your local emergency services (911 in [place] Do not drive  yourself to the hospital. MAKE SURE YOU:   Understand these instructions.  Will watch your condition.  Will get help right away if you are not doing well or get worse. Document Released: 10/14/2004 Document Revised: 01/09/2013 Document Reviewed: 08/10/2007 [facility] Patient Information 2015 [place], [place]. This information is not intended to replace advice given to you by your health care provider. Make sure you discuss any questions you have with your health care provider.    Palpitations A palpitation is the feeling that your heartbeat is irregular or is faster than normal. It may feel like your heart is fluttering or skipping a beat. Palpitations are usually not a serious problem. However, in some cases, you may need further medical evaluation. CAUSES  Palpitations can be caused by:  Smoking.  Caffeine or other stimulants, such as diet pills or energy drinks.  Alcohol.  Stress and anxiety.  Strenuous physical activity.  Fatigue.  Certain medicines.  Heart disease, especially if you have a history of irregular heart rhythms (arrhythmias), such as atrial fibrillation, atrial flutter, or supraventricular tachycardia.  An improperly working pacemaker or defibrillator. DIAGNOSIS  To find the cause of your palpitations, your health care provider will take your medical history and perform a physical exam. Your health care provider may also have you take a test called an ambulatory electrocardiogram (ECG). An ECG records your heartbeat patterns over a 24-hour period. You may also have other tests, such as:  Transthoracic echocardiogram (TTE). During echocardiography, sound waves are used to evaluate how blood flows through your heart.  Transesophageal echocardiogram (TEE).  Cardiac monitoring. This allows your health care provider to monitor your heart rate and rhythm in real time.  Holter monitor. This is a portable device that records your heartbeat and can help diagnose  heart arrhythmias. It allows your health care provider to track your heart activity for several days, if needed.  Stress tests by exercise or by giving medicine that makes the heart beat faster. TREATMENT  Treatment of palpitations depends on the cause of your symptoms and can vary greatly. Most cases of palpitations do not require any treatment other than time, relaxation, and monitoring your symptoms. Other causes, such as atrial fibrillation, atrial flutter, or supraventricular tachycardia, usually require further treatment. HOME CARE INSTRUCTIONS   Avoid:  Caffeinated coffee, tea, soft drinks, diet pills, and energy drinks.  Chocolate.  Alcohol.  Stop smoking if you smoke.  Reduce your stress and anxiety. Things that can help you relax include:  A method of controlling things in your body, such as your heartbeats, with your mind (biofeedback).  Yoga.  Meditation.  Physical activity such as swimming, jogging, or walking.  Get plenty of rest and sleep. SEEK MEDICAL CARE IF:   You continue to have a fast or irregular heartbeat beyond 24 hours.  Your palpitations occur more often. SEEK IMMEDIATE MEDICAL CARE IF:  You have chest pain or shortness of breath.  You have a severe headache.  You feel dizzy or you faint. MAKE SURE YOU:  Understand these instructions.  Will watch your condition.  Will get help right away if you are not doing well or get  worse. Document Released: 01/02/2000 Document Revised: 01/09/2013 Document Reviewed: 03/05/2011 [facility] Patient Information 2015 [place], [place]. This information is not intended to replace advice given to you by your health care provider. Make sure you discuss any questions you have with your health care provider.   Syncope Syncope is a medical term for fainting or passing out. This means you lose consciousness and drop to the ground. People are generally unconscious for less than 5 minutes. You may have some muscle  twitches for up to 15 seconds before waking up and returning to normal. Syncope occurs more often in older adults, but it can happen to anyone. While most causes of syncope are not dangerous, syncope can be a sign of a serious medical problem. It is important to seek medical care.  CAUSES  Syncope is caused by a sudden drop in blood flow to the brain. The specific cause is often not determined. Factors that can bring on syncope include:  Taking medicines that lower blood pressure.  Sudden changes in posture, such as standing up quickly.  Taking more medicine than prescribed.  Standing in one place for too long.  Seizure disorders.  Dehydration and excessive exposure to heat.  Low blood sugar (hypoglycemia).  Straining to have a bowel movement.  Heart disease, irregular heartbeat, or other circulatory problems.  Fear, emotional distress, seeing blood, or severe pain. SYMPTOMS  Right before fainting, you may:  Feel dizzy or light-headed.  Feel nauseous.  See all white or all black in your field of vision.  Have cold, clammy skin. DIAGNOSIS  Your health care provider will ask about your symptoms, perform a physical exam, and perform an electrocardiogram (ECG) to record the electrical activity of your heart. Your health care provider may also perform other heart or blood tests to determine the cause of your syncope which may include:  Transthoracic echocardiogram (TTE). During echocardiography, sound waves are used to evaluate how blood flows through your heart.  Transesophageal echocardiogram (TEE).  Cardiac monitoring. This allows your health care provider to monitor your heart rate and rhythm in real time.  Holter monitor. This is a portable device that records your heartbeat and can help diagnose heart arrhythmias. It allows your health care provider to track your heart activity for several days, if needed.  Stress tests by exercise or by giving medicine that makes the  heart beat faster. TREATMENT  In most cases, no treatment is needed. Depending on the cause of your syncope, your health care provider may recommend changing or stopping some of your medicines. HOME CARE INSTRUCTIONS  Have someone stay with you until you feel stable.  Do not drive, use machinery, or play sports until your health care provider says it is okay.  Keep all follow-up appointments as directed by your health care provider.  Lie down right away if you start feeling like you might faint. Breathe deeply and steadily. Wait until all the symptoms have passed.  Drink enough fluids to keep your urine clear or pale yellow.  If you are taking blood pressure or heart medicine, get up slowly and take several minutes to sit and then stand. This can reduce dizziness. SEEK IMMEDIATE MEDICAL CARE IF:   You have a severe headache.  You have unusual pain in the chest, abdomen, or back.  You are bleeding from your mouth or rectum, or you have black or tarry stool.  You have an irregular or very fast heartbeat.  You have pain with breathing.  You have repeated  fainting or seizure-like jerking during an episode.  You faint when sitting or lying down.  You have confusion.  You have trouble walking.  You have severe weakness.  You have vision problems. If you fainted, call your local emergency services (911 in [place] Do not drive yourself to the hospital.  MAKE SURE YOU:  Understand these instructions.  Will watch your condition.  Will get help right away if you are not doing well or get worse. Document Released: 01/04/2005 Document Revised: 01/09/2013 Document Reviewed: 03/05/2011 [facility] Patient Information 2015 [place], [place]. This information is not intended to replace advice given to you by your health care provider. Make sure you discuss any questions you have with your health care provider.

## 2014-10-08 NOTE — ED Provider Notes (Signed)
CSN: [id number]     Arrival date & time 10/08/14  1638 History   First MD Initiated Contact with Patient 10/08/14 1650     Chief Complaint  Patient presents with  . Chest Pain     (Consider location/radiation/quality/duration/timing/severity/associated sxs/prior Treatment) HPI  34 year old male presents with intermittent chest pain that started around 2 PM. He was at work when it first started. Feels like a fluttering in his chest and sharp chest pain. Pain seems to come and go lasting only a few seconds or minutes. No shortness of breath. 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 Bystanders told him he passed out. Still having fluttering in his chest but the chest pain seems to be gone. No prior history of this. No history of hypertension and hyperlipidemia, smoking, or drug abuse. Does not currently feel lightheaded. No history of leg swelling, leg pain, or clot.  Past Medical History  Diagnosis Date  . APHTHOUS ULCERS 05/30/2009  . CHALAZION, LEFT 05/30/2009  . DEPRESSION 11/01/2008  . Eating disorder   . Panic attack   . Syphilis   . Allergy    Past Surgical History  Procedure Laterality Date  . Tonsillectomy      REMOVED AT 34 YRS OLD  . Ankle surgery  1999    RIGHT   Family History  Problem Relation Age of Onset  . Alcohol abuse Neg Hx     family  . Arthritis Neg Hx     family  . Cancer Neg Hx     family hx - breast ,lung,colon,prostate ca  . Depression Neg Hx     family  . Hypertension Maternal Grandmother 71  . Diabetes Maternal Grandfather 70    TYPE II  . Hypertension Maternal Grandfather 60   Social History  Substance Use Topics  . Smoking status: Never Smoker   . Smokeless tobacco: None  . Alcohol Use: Yes     Comment: ALL DRINKS    Review of Systems  Constitutional: Negative for fever.  Respiratory: Negative for shortness of breath.   Cardiovascular: Positive for chest pain and palpitations.  Negative for leg swelling.  Neurological: Positive for syncope.  All other systems reviewed and are negative.     Allergies  Cephalexin  Home Medications   Prior to Admission medications   Medication Sig Start Date End Date Taking? Authorizing Provider  acyclovir (ZOVIRAX) 200 MG capsule Take 1 capsule (200 mg total) by mouth 5 (five) times daily. Patient taking differently: Take 200 mg by mouth See admin instructions. Fives times a day as needed for herpes. 05/16/13  Yes Kristian Covey, MD  clindamycin (CLEOCIN) 300 MG capsule Take 1 capsule (300 mg total) by mouth 3 (three) times daily. Patient not taking: Reported on 10/08/2014 09/06/14   Jerelyn Scott, MD  doxycycline (VIBRAMYCIN) 100 MG capsule Take 1 capsule (100 mg total) by mouth 2 (two) times daily. One po bid x 7 days Patient not taking: Reported on 10/08/2014 09/03/14   Fayrene Helper, PA-C   BP 117/74 mmHg  Pulse 71  Temp(Src) 97.9 F (36.6 C) (Oral)  Resp 16  Ht  (1.702 m)  Wt 162 lb (73.483 kg)  BMI 25.37 kg/m2  SpO2 99% Physical Exam  Constitutional: He is oriented to person, place, and time. He appears well-developed and well-nourished.  HENT:  Head: Normocephalic and atraumatic.  Right Ear: External ear normal.  Left Ear: External ear normal.  Nose: Nose normal.  Eyes: Right eye exhibits no discharge. Left eye exhibits no discharge.  Neck: Neck supple.  Cardiovascular: Normal rate, regular rhythm, normal heart sounds and intact distal pulses.   No murmur heard. Pulmonary/Chest: Effort normal and breath sounds normal. He has no wheezes. He has no rales. He exhibits tenderness.    Abdominal: Soft. He exhibits no distension. There is no tenderness.  Musculoskeletal: He exhibits no edema.  Neurological: He is alert and oriented to person, place, and time.  Skin: Skin is warm and dry.  Nursing note and vitals reviewed.   ED Course  Procedures (including critical care time) Labs Review Labs Reviewed    BASIC METABOLIC PANEL  CBC  D-DIMER, QUANTITATIVE (NOT AT [facility])  TROPONIN I  TROPONIN I    Imaging Review Dg Chest 2 View  10/08/2014   CLINICAL DATA:  Shortness of breath for 1 day  EXAM: CHEST  2 VIEW  COMPARISON:  09/03/2014  FINDINGS: The heart size and mediastinal contours are within normal limits. Both lungs are clear. The visualized skeletal structures are unremarkable.  IMPRESSION: No active cardiopulmonary disease.   Electronically Signed   By: Natasha Mead M.D.   On: 10/08/2014 18:01   I have personally reviewed and evaluated these images and lab results as part of my medical decision-making.   EKG Interpretation   Date/Time:  Tuesday October 08 2014 16:39:20 EDT Ventricular Rate:  71 PR Interval:  182 QRS Duration: 79 QT Interval:  355 QTC Calculation: 386 R Axis:   58 Text Interpretation:  Sinus rhythm Probable left atrial enlargement no  significant change since 2006 Confirmed by GOLDSTON  MD, SCOTT ([id number]) on  10/08/2014 4:50:49 PM      MDM   Final diagnoses:  Chest pain, unspecified chest pain type  Syncope, unspecified syncope type    Patient's chest pain is atypical. Unlikely to be ACS with no acute ST/T-wave changes, no risk factors, and 2 negative troponins. However his syncope is concerning. There is no murmur on exam and he does not have any irregular heartbeat or arrhythmia despite his feeling of "fluttering". I discussed with the cardiologist on call, Dr. Diona Browner, who recommends calling his PCP first thing in the morning for an outpatient Holter monitor as well as likely echo in the next 1-2 days. Discussed this with patient who agrees and will call his doctor in the morning. I discussed no exertional activity until he is cleared by his doctor.    Pricilla Loveless, MD 10/09/14 [phone]

## 2015-05-23 ENCOUNTER — Emergency Department (HOSPITAL_COMMUNITY): Payer: Self-pay

## 2015-05-23 ENCOUNTER — Encounter (HOSPITAL_COMMUNITY): Payer: Self-pay | Admitting: Emergency Medicine

## 2015-05-23 ENCOUNTER — Emergency Department (HOSPITAL_COMMUNITY)
Admission: EM | Admit: 2015-05-23 | Discharge: 2015-05-23 | Disposition: A | Discharge status: Home / Self Care | Payer: Self-pay | Attending: Emergency Medicine | Admitting: Emergency Medicine

## 2015-05-23 DIAGNOSIS — Y998 Other external cause status: Secondary | ICD-10-CM | POA: Insufficient documentation

## 2015-05-23 DIAGNOSIS — Z8669 Personal history of other diseases of the nervous system and sense organs: Secondary | ICD-10-CM | POA: Insufficient documentation

## 2015-05-23 DIAGNOSIS — Z8719 Personal history of other diseases of the digestive system: Secondary | ICD-10-CM | POA: Insufficient documentation

## 2015-05-23 DIAGNOSIS — Y9289 Other specified places as the place of occurrence of the external cause: Secondary | ICD-10-CM | POA: Insufficient documentation

## 2015-05-23 DIAGNOSIS — Z8659 Personal history of other mental and behavioral disorders: Secondary | ICD-10-CM | POA: Insufficient documentation

## 2015-05-23 DIAGNOSIS — M25571 Pain in right ankle and joints of right foot: Secondary | ICD-10-CM

## 2015-05-23 DIAGNOSIS — W108XXA Fall (on) (from) other stairs and steps, initial encounter: Secondary | ICD-10-CM | POA: Insufficient documentation

## 2015-05-23 DIAGNOSIS — S99911A Unspecified injury of right ankle, initial encounter: Secondary | ICD-10-CM | POA: Insufficient documentation

## 2015-05-23 DIAGNOSIS — Y9389 Activity, other specified: Secondary | ICD-10-CM | POA: Insufficient documentation

## 2015-05-23 MED ORDER — IBUPROFEN 400 MG PO TABS
800.0000 mg | ORAL_TABLET | Freq: Once | ORAL | Status: AC
  Administered 2015-05-23: 800 mg via ORAL
  Filled 2015-05-23: qty 2

## 2015-05-23 NOTE — ED Notes (Signed)
Twisted right ankle this am while going down steps. No deformity.

## 2015-05-23 NOTE — ED Provider Notes (Signed)
CSN: [id number]     Arrival date & time 05/23/15  [id number] History  By signing my name below, I, Edwin Garcia, attest that this documentation has been prepared under the direction and in the presence of Edwin HakeNicole Siegfried Vieth, PA-C Electronically Signed: Charline BillsEssence Garcia, ED Scribe 05/23/2015 at 9:37 AM.   Chief Complaint  Patient presents with  . Ankle Injury   The history is provided by the patient. No language interpreter was used.   HPI Comments: Edwin Garcia is a 35 y.o. male who presents to the Emergency Department complaining of a right ankle injury sustained this morning. Pt states that he fell down 5 steps this morning and rolled his right ankle inward. No head injury or LOC. Pt reports constant pain that is exacerbated with bearing weight or movement of right ankle. He also reports associated symptoms of joint swelling since the incident. No treatments tried PTA. Pt denies numbness/tingling. Pt reports previous right ankle surgery with hardware ~15 years ago.   Past Medical History  Diagnosis Date  . APHTHOUS ULCERS 05/30/2009  . CHALAZION, LEFT 05/30/2009  . DEPRESSION 11/01/2008  . Eating disorder   . Panic attack   . Syphilis   . Allergy    Past Surgical History  Procedure Laterality Date  . Tonsillectomy      REMOVED AT 35 YRS OLD  . Ankle surgery  1999    RIGHT   Family History  Problem Relation Age of Onset  . Alcohol abuse Neg Hx     family  . Arthritis Neg Hx     family  . Cancer Neg Hx     family hx - breast ,lung,colon,prostate ca  . Depression Neg Hx     family  . Hypertension Maternal Grandmother [id number]  . Diabetes Maternal Grandfather 70    TYPE II  . Hypertension Maternal Grandfather [id number]   Social History  Substance Use Topics  . Smoking status: Never Smoker   . Smokeless tobacco: None  . Alcohol Use: No     Comment: ALL DRINKS    Review of Systems  Musculoskeletal: Positive for joint swelling and arthralgias.  Neurological: Negative for numbness.    Allergies  Cephalexin  Home Medications   Prior to Admission medications   Medication Sig Start Date End Date Taking? Authorizing Provider  acyclovir (ZOVIRAX) 200 MG capsule Take 1 capsule (200 mg total) by mouth 5 (five) times daily. Patient taking differently: Take 200 mg by mouth See admin instructions. Fives times a day as needed for herpes. 05/16/13   Edwin CoveyBruce W Burchette, MD  clindamycin (CLEOCIN) 300 MG capsule Take 1 capsule (300 mg total) by mouth 3 (three) times daily. Patient not taking: Reported on 10/08/2014 09/06/14   Edwin ScottMartha Linker, MD  doxycycline (VIBRAMYCIN) 100 MG capsule Take 1 capsule (100 mg total) by mouth 2 (two) times daily. One po bid x 7 days Patient not taking: Reported on 10/08/2014 09/03/14   Edwin HelperBowie Tran, PA-C   BP 121/74 mmHg  Pulse 84  Temp(Src) 98.1 F (36.7 C) (Oral)  Resp 14  SpO2 100% Physical Exam  Constitutional: He is oriented to person, place, and time. He appears well-developed and well-nourished. No distress.  HENT:  Head: Normocephalic and atraumatic.  Eyes: Conjunctivae are normal. Pupils are equal, round, and reactive to light.  Neck: Neck supple.  Cardiovascular: Normal rate.   Pulmonary/Chest: Effort normal.  Musculoskeletal: Normal range of motion.  Mild swelling over R lateral malleolus. Tenderness to palpation over medial and lateral  malleolus and R anterior ankle. Decreased ROM of R ankle due to pain. Full ROM of R toes and knee. 2+ DP pulses. Sensation grossly intact. Cap refill intact.   Neurological: He is alert and oriented to person, place, and time.  Skin: Skin is warm and dry.  Psychiatric: He has a normal mood and affect. His behavior is normal.  Nursing note and vitals reviewed.  ED Course  Procedures (including critical care time) DIAGNOSTIC STUDIES: Oxygen Saturation is 100% on RRA, normal by my interpretation.    COORDINATION OF CARE: 9:27 AM-Discussed treatment plan which includes XR and ibuprofen with pt at bedside  and pt agreed to plan.   Labs Review Labs Reviewed - No data to display  Imaging Review Dg Ankle Complete Right  05/23/2015  CLINICAL DATA:  Acute right ankle pain after fall down steps today. Initial encounter. EXAM: RIGHT ANKLE - COMPLETE 3+ VIEW COMPARISON:  February 22, 2009. FINDINGS: Status post surgical internal fixation of medial malleolus which is unchanged compared to prior exam. No fracture or dislocation is noted. Joint spaces are intact. IMPRESSION: No acute abnormality seen in the right ankle. Electronically Signed   By: Edwin Garcia, M.D.   On: 05/23/2015 09:21   I have personally reviewed and evaluated these images and lab results as part of my medical decision-making.   EKG Interpretation None      MDM   Final diagnoses:  Ankle pain, right   Patient presented with right ankle pain after a fall that occurred prior to arrival. Denies head injury or LOC. VSS. Exam revealed mild swelling and tenderness to right ankle. Right lower extremities otherwise are vascular intact. Patient X-Ray negative for obvious fracture or dislocation. I suspect patient's symptoms are likely due to ankle sprain associated with recent fall. Pain managed in ED. Pt advised to follow up with orthopedics if symptoms persist. ACE wrap placed on right ankle while in ED, conservative therapy recommended and discussed symptomatic tx including RICE protocol with pt. Patient will be dc home & is agreeable with above plan. Discussed strict return precautions with pt.   I personally performed the services described in this documentation, which was scribed in my presence. The recorded information has been reviewed and is accurate.    Edwin Garcia, [place] 05/23/15 1105  Edwin Kaplan, MD 05/24/15 [phone]

## 2015-05-23 NOTE — Discharge Instructions (Signed)
I recommend continuing to take 600 mg ibuprofen 4 times daily. I recommend eating prior to taking ibuprofen to prevent gastric intestinal side effects. Continue to rest, elevate and apply ice to her right ankle for 15-20 minutes 3-4 times daily. Please follow up with a primary care provider from the Resource Guide provided below in 1-2 weeks if your pain has not improved. Please return to the Emergency Department if symptoms worsen or new onset of fever, redness, swelling, numbness, tingling, weakness.

## 2015-06-03 ENCOUNTER — Encounter (HOSPITAL_COMMUNITY): Payer: Self-pay

## 2015-06-03 ENCOUNTER — Emergency Department (HOSPITAL_COMMUNITY)
Admission: EM | Admit: 2015-06-03 | Discharge: 2015-06-03 | Disposition: A | Discharge status: Home / Self Care | Payer: Self-pay | Attending: Emergency Medicine | Admitting: Emergency Medicine

## 2015-06-03 DIAGNOSIS — Z8669 Personal history of other diseases of the nervous system and sense organs: Secondary | ICD-10-CM | POA: Insufficient documentation

## 2015-06-03 DIAGNOSIS — Z8619 Personal history of other infectious and parasitic diseases: Secondary | ICD-10-CM | POA: Insufficient documentation

## 2015-06-03 DIAGNOSIS — Z8659 Personal history of other mental and behavioral disorders: Secondary | ICD-10-CM | POA: Insufficient documentation

## 2015-06-03 DIAGNOSIS — M545 Low back pain, unspecified: Secondary | ICD-10-CM

## 2015-06-03 DIAGNOSIS — Z79899 Other long term (current) drug therapy: Secondary | ICD-10-CM | POA: Insufficient documentation

## 2015-06-03 DIAGNOSIS — Z8719 Personal history of other diseases of the digestive system: Secondary | ICD-10-CM | POA: Insufficient documentation

## 2015-06-03 DIAGNOSIS — M62830 Muscle spasm of back: Secondary | ICD-10-CM | POA: Insufficient documentation

## 2015-06-03 MED ORDER — METHOCARBAMOL 500 MG PO TABS: 500.0000 mg | ORAL_TABLET | Freq: Two times a day (BID) | ORAL | Status: DC

## 2015-06-03 MED ORDER — IBUPROFEN 800 MG PO TABS: 800.0000 mg | ORAL_TABLET | Freq: Three times a day (TID) | ORAL | Status: DC

## 2015-06-03 NOTE — Discharge Instructions (Signed)
Take your medications as prescribed as needed for pain relief. I recommend eating prior to taking ibuprofen to prevent gastrointestinal side effects. I also recommend applying heat to affected area for 15-20 minutes 3-4 times daily. Refrain from doing any heavy lifting, squatting or repetitive movements that exacerbate your pain for the next 4-5 days until your pain has improved. Follow-up with your primary care provider in the next 1-2 weeks your pain has not improved. Please return to the Emergency Department if symptoms worsen or new onset of fever, numbness, tingling, groin numbness, abdominal pain, urinary retention, pain with urination, loss of bowel or bladder, weakness.

## 2015-06-03 NOTE — ED Notes (Signed)
See PAs notes for secondary assessment. Pt A&OX4, ambulatory at d/c with steady gait, NAD

## 2015-06-03 NOTE — ED Provider Notes (Signed)
CSN: [id number]     Arrival date & time 06/03/15  1610 History  By signing my name below, I, Linna DarnerRussell Turner, attest that this documentation has been prepared under the direction and in the presence of non-physician practitioner, Melburn HakeNicole Nadeau, PA-C. Electronically Signed: Linna Darnerussell Turner, Scribe. 06/03/2015. 5:07 PM.   Chief Complaint  Patient presents with  . Back Pain    The history is provided by the patient. No language interpreter was used.     HPI Comments: Edwin Garcia is a 35 y.o. male who presents to the Emergency Department complaining of sudden onset, intermittent, sharp, worsening, bilateral lower and middle back pain and spasms beginning 6 days ago. Pt states that his symptoms began six days ago, resolved on their own, and then presented again today at work; he states that his back is in pain currently. He states that his back pain is localized and non-radiating. He endorses severe pain exacerbation with movement, bending, and palpation to the sides of his lower and middle back. He reports that he fell at work around a month ago and landed on his right hip; he sustained a significant painful bruise to his right hip but did not experience back problems as a result of this incident. He did not hit his head or lose consciousness during the fall. Pt has not done any new exercises recently. He has stretched and taken aspirin for his pain with no relief. He has no h/o cancer or IV drug use. Pt has not seen a chiropractor or acupuncturist recently. He denies known injury to his back. He further denies fever, chills, myalgias, abdominal pain, nausea, vomiting, dysuria, difficulty urinating, bowel/bladder incontinence, numbness in groin or bilateral legs, weakness in bilateral legs, neck pain, upper back pain, neuro deficits, or any other associated symptoms. Pt is ambulatory. Pt does not have a PCP currently but has an appointment scheduled with a new provider next month.  Past Medical History   Diagnosis Date  . APHTHOUS ULCERS 05/30/2009  . CHALAZION, LEFT 05/30/2009  . DEPRESSION 11/01/2008  . Eating disorder   . Panic attack   . Syphilis   . Allergy    Past Surgical History  Procedure Laterality Date  . Tonsillectomy      REMOVED AT 35 YRS OLD  . Ankle surgery  1999    RIGHT   Family History  Problem Relation Age of Onset  . Alcohol abuse Neg Hx     family  . Arthritis Neg Hx     family  . Cancer Neg Hx     family hx - breast ,lung,colon,prostate ca  . Depression Neg Hx     family  . Hypertension Maternal Grandmother 6033  . Diabetes Maternal Grandfather 70    TYPE II  . Hypertension Maternal Grandfather 5545   Social History  Substance Use Topics  . Smoking status: Never Smoker   . Smokeless tobacco: None  . Alcohol Use: No     Comment: ALL DRINKS    Review of Systems  Constitutional: Negative for fever and chills.  Gastrointestinal: Negative for nausea, vomiting and abdominal pain.  Genitourinary: Negative for dysuria and difficulty urinating.  Musculoskeletal: Positive for back pain. Negative for myalgias and neck pain.  Skin: Negative for wound.  Neurological: Negative for weakness and numbness.    Allergies  Cephalexin  Home Medications   Prior to Admission medications   Medication Sig Start Date End Date Taking? Authorizing Provider  acyclovir (ZOVIRAX) 200 MG capsule Take 1  capsule (200 mg total) by mouth 5 (five) times daily. Patient taking differently: Take 200 mg by mouth See admin instructions. Fives times a day as needed for herpes. 05/16/13   Kristian Covey, MD  clindamycin (CLEOCIN) 300 MG capsule Take 1 capsule (300 mg total) by mouth 3 (three) times daily. Patient not taking: Reported on 10/08/2014 09/06/14   Jerelyn Scott, MD  doxycycline (VIBRAMYCIN) 100 MG capsule Take 1 capsule (100 mg total) by mouth 2 (two) times daily. One po bid x 7 days Patient not taking: Reported on 10/08/2014 09/03/14   Fayrene Helper, PA-C  ibuprofen  (ADVIL,MOTRIN) 800 MG tablet Take 1 tablet (800 mg total) by mouth 3 (three) times daily. 06/03/15   Barrett Henle, PA-C  methocarbamol (ROBAXIN) 500 MG tablet Take 1 tablet (500 mg total) by mouth 2 (two) times daily. 06/03/15   Satira Sark Nadeau, PA-C   BP 132/80 mmHg  Pulse 94  Temp(Src) 98.3 F (36.8 C) (Oral)  Resp 16  SpO2 100% Physical Exam  Constitutional: He is oriented to person, place, and time. He appears well-developed and well-nourished. No distress.  HENT:  Head: Normocephalic and atraumatic.  Eyes: Conjunctivae and EOM are normal.  Neck: Neck supple. No tracheal deviation present.  Cardiovascular: Normal rate, regular rhythm, normal heart sounds and intact distal pulses.   Pulmonary/Chest: Effort normal and breath sounds normal. No respiratory distress. He has no wheezes. He has no rales. He exhibits no tenderness.  Abdominal: Soft. Bowel sounds are normal. He exhibits no distension and no mass. There is no tenderness. There is no rebound and no guarding.  Musculoskeletal:  No midline C, T, or L tenderness. Tender to palpation over bilateral thoracic and lumbar paraspinal muscles with palpable muscle spasm noted. Full range of motion of neck and decreased ROM of back due to reported pain. Full range of motion of bilateral upper and lower extremities, with 5/5 strength. Sensation intact. 2+ radial and PT pulses. Cap refill <2 seconds. Patient able to stand and ambulate without assistance.  Lymphadenopathy:    He has no cervical adenopathy.  Neurological: He is alert and oriented to person, place, and time. He has normal strength and normal reflexes. No sensory deficit. Gait normal.  Skin: Skin is warm and dry.  Psychiatric: He has a normal mood and affect. His behavior is normal.  Nursing note and vitals reviewed.   ED Course  Procedures (including critical care time)  DIAGNOSTIC STUDIES: Oxygen Saturation is 100% on RA, normal by my interpretation.     COORDINATION OF CARE: 5:07 PM Discussed treatment plan with pt at bedside and pt agreed to plan.  Labs Review Labs Reviewed - No data to display  Imaging Review No results found. I have personally reviewed and evaluated these images and lab results as part of my medical decision-making.   EKG Interpretation None      MDM   Final diagnoses:  Bilateral low back pain without sciatica    Patient with back pain. Palpable muscle spasm noted to bilateral mid thoracic paraspinal muscles, decreased range of motion of back due to reported pain. No neurological deficits and normal neuro exam.  Patient can walk but states is painful.  No loss of bowel or bladder control.  No concern for cauda equina.  No fever, night sweats, weight loss, h/o cancer, IVDU. I suspect patient's pain is likely due to muscle spasm and do not feel any further workup or imaging is warranted at this time. Plan to  discharge patient home with symptomatic treatment including muscle relaxant,  RICE protocol and pain medicine. Advised patient to follow up with his primary care provider in the next 1-2 weeks if his pain has not improved or has worsened. Discussed return precautions with patient.  I personally performed the services described in this documentation, which was scribed in my presence. The recorded information has been reviewed and is accurate.   Satira Sark [place], [place] 06/03/15 1809  Nelva Nay, MD 06/05/15 [phone]

## 2015-06-03 NOTE — ED Notes (Signed)
Pt reporting lower back pain, worse with movement and bending over. He states it started Sunday, went away but the pain came back again today.Pt was ambulatory with steady gait to room, NAD

## 2015-06-25 ENCOUNTER — Emergency Department (HOSPITAL_COMMUNITY)
Admission: EM | Admit: 2015-06-25 | Discharge: 2015-06-25 | Disposition: A | Discharge status: Home / Self Care | Payer: Self-pay | Attending: Emergency Medicine | Admitting: Emergency Medicine

## 2015-06-25 ENCOUNTER — Encounter (HOSPITAL_COMMUNITY): Payer: Self-pay | Admitting: *Deleted

## 2015-06-25 DIAGNOSIS — A6000 Herpesviral infection of urogenital system, unspecified: Secondary | ICD-10-CM

## 2015-06-25 DIAGNOSIS — Z791 Long term (current) use of non-steroidal anti-inflammatories (NSAID): Secondary | ICD-10-CM | POA: Insufficient documentation

## 2015-06-25 DIAGNOSIS — Z79899 Other long term (current) drug therapy: Secondary | ICD-10-CM | POA: Insufficient documentation

## 2015-06-25 DIAGNOSIS — L309 Dermatitis, unspecified: Secondary | ICD-10-CM | POA: Insufficient documentation

## 2015-06-25 DIAGNOSIS — A6002 Herpesviral infection of other male genital organs: Secondary | ICD-10-CM | POA: Insufficient documentation

## 2015-06-25 DIAGNOSIS — Z792 Long term (current) use of antibiotics: Secondary | ICD-10-CM | POA: Insufficient documentation

## 2015-06-25 LAB — GC/CHLAMYDIA PROBE AMP (~~LOC~~) NOT AT ARMC
Chlamydia: NEGATIVE
Neisseria Gonorrhea: NEGATIVE

## 2015-06-25 LAB — HIV ANTIBODY (ROUTINE TESTING W REFLEX): HIV SCREEN 4TH GENERATION: NONREACTIVE

## 2015-06-25 LAB — RPR: RPR Ser Ql: NONREACTIVE

## 2015-06-25 MED ORDER — KETOCONAZOLE 2 % EX SHAM: 1.0000 "application " | MEDICATED_SHAMPOO | CUTANEOUS | Status: AC

## 2015-06-25 MED ORDER — ACYCLOVIR 800 MG PO TABS: 800.0000 mg | ORAL_TABLET | Freq: Two times a day (BID) | ORAL | Status: DC

## 2015-06-25 NOTE — ED Notes (Signed)
Declined W/C at D/C and was escorted to lobby by RN. 

## 2015-06-25 NOTE — ED Notes (Signed)
Pt reports skin rash to scalp that bleeds for one month. Pt also reports a Hx of gentail herpes . Pt has a current out break.

## 2015-06-25 NOTE — ED Provider Notes (Signed)
CSN: [id number]     Arrival date & time 06/25/15  0730 History   First MD Initiated Contact with Patient 06/25/15 0800     Chief Complaint  Patient presents with  . Rash     (Consider location/radiation/quality/duration/timing/severity/associated sxs/prior Treatment) HPI Comments: Patient with a history of Genital Herpes and Syphilis presents today with both a rash of the scalp and also a painful lesion of the penis.  He states that the rash to the scalp has been present for the past month.  He states that he had been getting his hair done and they did some sort of a scrub to his scalp.  Rash does itch.  He feels like it is getting worse.  He has not taken anything for the rash prior to arrival.  Patient is also complaining of a painful lesion to his penis that has been present since last week.  He reports that symptoms are consistent with the symptoms that he has had in the past with a Herpes outbreak.  He denies any penile drainage or drainage from the lesion.  Denies scrotal pain or swelling.  Denies fever, chills, nausea, or vomiting.  No treatment prior to arrival.  He is sexually active.  Patient is a 35 y.o. male presenting with rash. The history is provided by the patient.  Rash   Past Medical History  Diagnosis Date  . APHTHOUS ULCERS 05/30/2009  . CHALAZION, LEFT 05/30/2009  . DEPRESSION 11/01/2008  . Eating disorder   . Panic attack   . Syphilis   . Allergy    Past Surgical History  Procedure Laterality Date  . Tonsillectomy      REMOVED AT 35 YRS OLD  . Ankle surgery  1999    RIGHT   Family History  Problem Relation Age of Onset  . Alcohol abuse Neg Hx     family  . Arthritis Neg Hx     family  . Cancer Neg Hx     family hx - breast ,lung,colon,prostate ca  . Depression Neg Hx     family  . Hypertension Maternal Grandmother 5033  . Diabetes Maternal Grandfather 70    TYPE II  . Hypertension Maternal Grandfather 2845   Social History  Substance Use Topics  .  Smoking status: Never Smoker   . Smokeless tobacco: None  . Alcohol Use: No     Comment: ALL DRINKS    Review of Systems  Skin: Positive for rash.  All other systems reviewed and are negative.     Allergies  Cephalexin  Home Medications   Prior to Admission medications   Medication Sig Start Date End Date Taking? Authorizing Provider  acyclovir (ZOVIRAX) 200 MG capsule Take 1 capsule (200 mg total) by mouth 5 (five) times daily. Patient taking differently: Take 200 mg by mouth See admin instructions. Fives times a day as needed for herpes. 05/16/13   Kristian CoveyBruce W Burchette, MD  clindamycin (CLEOCIN) 300 MG capsule Take 1 capsule (300 mg total) by mouth 3 (three) times daily. Patient not taking: Reported on 10/08/2014 09/06/14   Jerelyn ScottMartha Linker, MD  doxycycline (VIBRAMYCIN) 100 MG capsule Take 1 capsule (100 mg total) by mouth 2 (two) times daily. One po bid x 7 days Patient not taking: Reported on 10/08/2014 09/03/14   Fayrene HelperBowie Tran, PA-C  ibuprofen (ADVIL,MOTRIN) 800 MG tablet Take 1 tablet (800 mg total) by mouth 3 (three) times daily. 06/03/15   Barrett HenleNicole Elizabeth Nadeau, PA-C  methocarbamol (ROBAXIN) 500 MG tablet  Take 1 tablet (500 mg total) by mouth 2 (two) times daily. 06/03/15   Satira Sark Nadeau, PA-C   BP 126/75 mmHg  Pulse 72  Temp(Src) 98.2 F (36.8 C) (Oral)  Resp 16  SpO2 100% Physical Exam  Constitutional: He appears well-developed and well-nourished.  HENT:  Head: Normocephalic and atraumatic.  Scaly erythematous rash to the top of the scalp.  No drainage.  Neck: Normal range of motion. Neck supple.  Cardiovascular: Normal rate, regular rhythm and normal heart sounds.   Pulmonary/Chest: Effort normal and breath sounds normal.  Genitourinary:    Right testis shows no mass, no swelling and no tenderness. Left testis shows no mass, no swelling and no tenderness. Circumcised. No discharge found.  Musculoskeletal: Normal range of motion.  Neurological: He is alert.    Skin: Skin is warm and dry.  Psychiatric: He has a normal mood and affect.  Nursing note and vitals reviewed.   ED Course  Procedures (including critical care time) Labs Review Labs Reviewed  RPR  HIV ANTIBODY (ROUTINE TESTING)  GC/CHLAMYDIA PROBE AMP ([place]) NOT AT [facility]    Imaging Review No results found. I have personally reviewed and evaluated these images and lab results as part of my medical decision-making.   EKG Interpretation None      MDM   Final diagnoses:  None   Patient with history of Genital Herpes and Syphilis presents today with an ulcerated painful lesion of the penis.  Appearance and the fact that it is painful are more consistent with Genital Herpes than Syphilis.  GC/Chlamydia, RPR, and HIV pending.  Patient stable for discharge.  Return precautions given.  Patient educated on safe sex practices.  Given Rx for Acyclovir.    Santiago Glad, PA-C 06/25/15 1616  Mancel Bale, MD 06/26/15 [phone]

## 2015-06-25 NOTE — Discharge Instructions (Signed)
It is recommended to apply over the counter Hydrocortisone to the scalp if you do not see any improvement after using the shampoo for 2 weeks.

## 2015-09-09 ENCOUNTER — Emergency Department (HOSPITAL_COMMUNITY): Payer: Self-pay

## 2015-09-09 ENCOUNTER — Encounter (HOSPITAL_COMMUNITY): Payer: Self-pay

## 2015-09-09 DIAGNOSIS — Y939 Activity, unspecified: Secondary | ICD-10-CM | POA: Insufficient documentation

## 2015-09-09 DIAGNOSIS — W1830XA Fall on same level, unspecified, initial encounter: Secondary | ICD-10-CM | POA: Insufficient documentation

## 2015-09-09 DIAGNOSIS — Y929 Unspecified place or not applicable: Secondary | ICD-10-CM | POA: Insufficient documentation

## 2015-09-09 DIAGNOSIS — Y990 Civilian activity done for income or pay: Secondary | ICD-10-CM | POA: Insufficient documentation

## 2015-09-09 DIAGNOSIS — R0602 Shortness of breath: Secondary | ICD-10-CM | POA: Insufficient documentation

## 2015-09-09 DIAGNOSIS — Z79899 Other long term (current) drug therapy: Secondary | ICD-10-CM | POA: Insufficient documentation

## 2015-09-09 DIAGNOSIS — R55 Syncope and collapse: Secondary | ICD-10-CM | POA: Insufficient documentation

## 2015-09-09 LAB — BASIC METABOLIC PANEL
Anion gap: 9 (ref 5–15)
BUN: 6 mg/dL (ref 6–20)
CALCIUM: 9.9 mg/dL (ref 8.9–10.3)
CHLORIDE: 102 mmol/L (ref 101–111)
CO2: 24 mmol/L (ref 22–32)
CREATININE: 0.79 mg/dL (ref 0.61–1.24)
GFR calc Af Amer: >60 mL/min (ref >60)
GFR calc non Af Amer: >60 mL/min (ref >60)
Glucose, Bld: 91 mg/dL (ref 65–99)
Potassium: 3.5 mmol/L (ref 3.5–5.1)
SODIUM: 135 mmol/L (ref 135–145)

## 2015-09-09 LAB — CBC WITH DIFFERENTIAL/PLATELET
BASOS PCT: 0 %
Basophils Absolute: 0 10*3/uL (ref 0.0–0.1)
EOS ABS: 0.1 10*3/uL (ref 0.0–0.7)
Eosinophils Relative: 1 %
HCT: 48.4 % (ref 39.0–52.0)
HEMOGLOBIN: 16.2 g/dL (ref 13.0–17.0)
LYMPHS PCT: 22 %
Lymphs Abs: 1.4 10*3/uL (ref 0.7–4.0)
MCH: 30 pg (ref 26.0–34.0)
MCHC: 33.5 g/dL (ref 30.0–36.0)
MCV: 89.6 fL (ref 78.0–100.0)
Monocytes Absolute: 0.4 10*3/uL (ref 0.1–1.0)
Monocytes Relative: 6 %
NEUTROS ABS: 4.3 10*3/uL (ref 1.7–7.7)
Neutrophils Relative %: 71 %
Platelets: ADEQUATE 10*3/uL (ref 150–400)
RBC: 5.4 MIL/uL (ref 4.22–5.81)
RDW: 13.4 % (ref 11.5–15.5)
Smear Review: ADEQUATE
WBC: 6.2 10*3/uL (ref 4.0–10.5)

## 2015-09-09 LAB — RAPID URINE DRUG SCREEN, HOSP PERFORMED
Amphetamines: NOT DETECTED
BARBITURATES: NOT DETECTED
Benzodiazepines: NOT DETECTED
Cocaine: NOT DETECTED
Opiates: NOT DETECTED
TETRAHYDROCANNABINOL: NOT DETECTED

## 2015-09-09 LAB — MAGNESIUM: Magnesium: 2.1 mg/dL (ref 1.7–2.4)

## 2015-09-09 NOTE — ED Triage Notes (Signed)
Pt reports he was sitting at picnic table at work, pt felt weak, nausea, short of breath, passed out, before pt fell backwards off picnic table co-workers caught pt.  Pt out for 9 minutes.  EMS was called, pt refused transport, EMS advised pt to come to ED immediately.

## 2015-09-10 ENCOUNTER — Emergency Department (HOSPITAL_COMMUNITY)
Admission: EM | Admit: 2015-09-10 | Discharge: 2015-09-10 | Disposition: A | Discharge status: Home / Self Care | Payer: Self-pay | Attending: Emergency Medicine | Admitting: Emergency Medicine

## 2015-09-10 DIAGNOSIS — R55 Syncope and collapse: Secondary | ICD-10-CM

## 2015-09-10 LAB — D-DIMER, QUANTITATIVE (NOT AT ARMC): D DIMER QUANT: 0.27 ug{FEU}/mL (ref 0.00–0.50)

## 2015-09-10 NOTE — ED Provider Notes (Signed)
[facility]-EMERGENCY DEPT Provider Note   CSN: [id number] Arrival date & time: 09/09/15  2228  By signing my name below, I, Edwin Garcia, attest that this documentation has been prepared under the direction and in the presence of [name], MD . Electronically Signed: Majel Garcia, Scribe. 09/10/2015. 3:41 AM.  History   Chief Complaint Chief Complaint  Patient presents with  . Shortness of Breath  . Loss of Consciousness   The history is provided by the patient. No language interpreter was used.   HPI Comments: Edwin Garcia is a 35 y.o. male who presents to the Emergency Department complaining of gradually improving, shortness of breath s/p a fall and syncopal episode that occurred this morning. Pt reports he was sitting at a picnic table at work when he suddenly began to feel anxious, weak, nauseous and short of breath before he passed out, falling backwards. He notes he lost consciousness for ~9 minutes. Pt reports a hx of sudden syncope that was similar to his experience today in which he visited the ED and was told he had an anxiety attack. Pt denies shortness of breath now in the ED and hx of any heart problems.   Past Medical History:  Diagnosis Date  . Allergy   . APHTHOUS ULCERS 05/30/2009  . CHALAZION, LEFT 05/30/2009  . DEPRESSION 11/01/2008  . Eating disorder   . Panic attack   . Syphilis     Patient Active Problem List   Diagnosis Date Noted  . Genital herpes 11/02/2011  . History of syphilis 08/18/2011  . CHALAZION, LEFT 05/30/2009  . APHTHOUS ULCERS 05/30/2009  . DEPRESSION 11/01/2008    Past Surgical History:  Procedure Laterality Date  . ANKLE SURGERY  1999   RIGHT  . TONSILLECTOMY     REMOVED AT 35 YRS OLD    Home Medications    Prior to Admission medications   Medication Sig Start Date End Date Taking? Authorizing Provider  acyclovir (ZOVIRAX) 800 MG tablet Take 1 tablet (800 mg total) by mouth 2 (two) times daily. Patient taking differently:  Take 800 mg by mouth 2 (two) times daily as needed (outbreak).  06/25/15  Yes Heather Laisure, PA-C  ketoconazole (NIZORAL) 2 % shampoo Apply 1 application topically 2 (two) times a week. Use for 3 weeks.  Leave shampoo on for 3-5 minutes before rinsing. 06/25/15  Yes Santiago Glad, PA-C    Family History Family History  Problem Relation Age of Onset  . Hypertension Maternal Grandmother 72  . Diabetes Maternal Grandfather 70    TYPE II  . Hypertension Maternal Grandfather 45  . Alcohol abuse Neg Hx     family  . Arthritis Neg Hx     family  . Cancer Neg Hx     family hx - breast ,lung,colon,prostate ca  . Depression Neg Hx     family    Social History Social History  Substance Use Topics  . Smoking status: Never Smoker  . Smokeless tobacco: Never Used  . Alcohol use No     Comment: ALL DRINKS     Allergies   Cephalexin   Review of Systems Review of Systems  Respiratory: Positive for shortness of breath.   Gastrointestinal: Positive for nausea.  Neurological: Positive for syncope and weakness.   Physical Exam Updated Vital Signs BP 122/75 (BP Location: Left Arm)   Pulse 78   Temp 98.1 F (36.7 C) (Oral)   Resp 19   Ht 5\' 6"  (1.676 m)  Wt 178 lb (80.7 kg)   SpO2 100%   BMI 28.73 kg/m   Physical Exam  Constitutional: He is oriented to person, place, and time. He appears well-developed and well-nourished. No distress.  HENT:  Head: Normocephalic and atraumatic.  Right Ear: Hearing normal.  Left Ear: Hearing normal.  Nose: Nose normal.  Mouth/Throat: Oropharynx is clear and moist and mucous membranes are normal.  Eyes: Conjunctivae and EOM are normal. Pupils are equal, round, and reactive to light.  Neck: Normal range of motion. Neck supple.  Cardiovascular: Regular rhythm, S1 normal and S2 normal.  Exam reveals no gallop and no friction rub.   No murmur heard. Pulmonary/Chest: Effort normal and breath sounds normal. No respiratory distress. He exhibits no  tenderness.  Abdominal: Soft. Normal appearance and bowel sounds are normal. There is no hepatosplenomegaly. There is no tenderness. There is no rebound, no guarding, no tenderness at McBurney's point and negative Murphy's sign. No hernia.  Musculoskeletal: Normal range of motion.  Neurological: He is alert and oriented to person, place, and time. He has normal strength. No cranial nerve deficit or sensory deficit. Coordination normal. GCS eye subscore is 4. GCS verbal subscore is 5. GCS motor subscore is 6.  Skin: Skin is warm, dry and intact. No rash noted. No cyanosis.  Psychiatric: He has a normal mood and affect. His speech is normal and behavior is normal. Thought content normal.  Nursing note and vitals reviewed.    ED Treatments / Results  Labs (all labs ordered are listed, but only abnormal results are displayed) Labs Reviewed  CBC WITH DIFFERENTIAL/PLATELET  BASIC METABOLIC PANEL  URINE RAPID DRUG SCREEN, HOSP PERFORMED  MAGNESIUM  D-DIMER, QUANTITATIVE (NOT AT [facility])    EKG  EKG Interpretation  Date/Time:  Wednesday September 10 2015 03:34:29 EDT Ventricular Rate:  78 PR Interval:  138 QRS Duration: 157 QT Interval:  421 QTC Calculation: 480 R Axis:   62 Text Interpretation:  Sinus rhythm Left bundle branch block Confirmed by POLLINA  MD, CHRISTOPHER ([id number]) on 09/10/2015 3:40:52 AM      LBBB new since previous EKG   Radiology Dg Chest 2 View  Result Date: 09/09/2015 CLINICAL DATA:  Syncope EXAM: CHEST  2 VIEW COMPARISON:  09/2014 FINDINGS: Normal heart size and mediastinal contours. No acute infiltrate or edema. No effusion or pneumothorax. No acute osseous findings. IMPRESSION: No active cardiopulmonary disease. Electronically Signed   By: Marnee SpringJonathon  Watts M.D.   On: 09/09/2015 23:56    Procedures Procedures  DIAGNOSTIC STUDIES:  Oxygen Saturation is 100% on RA, normal by my interpretation.    COORDINATION OF CARE:  3:32 AM Discussed treatment plan with pt  at bedside and pt agreed to plan.  Medications Ordered in ED Medications - No data to display  Initial Impression / Assessment and Plan / ED Course  I have reviewed the triage vital signs and the nursing notes.  Pertinent labs & imaging results that were available during my care of the patient were reviewed by me and considered in my medical decision making (see chart for details).  Clinical Course   Patient presents to the emergency department after syncopal episode. Patient reports a history of anxiety and panic attack with syncope in the past. Today he was sitting when he started to feel like he was short of breath, then had racing heartbeat and then collapsed. He was reportedly unconscious for about 9 minutes. This all occurred while he was at work. At arrival to the ER,  patient reports that he is feeling better. He is no longer feeling short of breath. He is basically symptom-free at arrival.  Blood work was unremarkable. EKG, however, shows left bundle branch block. This is a new finding for him. He is not expressing any chest pain at this time. I do not suspect that this indicates acute MI. Troponin was negative. Patient also had a d-dimer performed because he was short of breath at the time of syncope. This was negative. As he is not hypoxic, tachycardic or short of breath at this time, I believe this is a good indication that he did not suffer PE.  Based on the fact that he has an abnormal EKG, I recommended admission. I discussed with him the possibility of cardiac abnormality causing his syncope that could recur and he is at risk for death and disability. He understands this but reports that he has many things that he needs to do today cannot be admitted at this time. After extensive conversation with him about risks and benefits, he has declined admission. Patient reports that he will contact his doctor this morning and be seen as soon as possible. He is also referred to cardiology.  Patient was counseled that he needs to return to the ER immediately if he has any further events.  [place] Syncope Rule from [employer]com  on 09/10/2015  RESULT SUMMARY:     Patient is NOT in the low-risk group for serious outcome.   INPUTS: Congestive heart failure history -> 0 = No Hematocrit <30% -> 0 = No EKG abnormal (EKG changed, or any non-sinus rhythm on EKG or monitoring) -> 1 = Yes Shortness of breath symptoms -> 1 = Yes Systolic BP <90 mmHg at triage -> 0 = No   I personally performed the services described in this documentation, which was scribed in my presence. The recorded information has been reviewed and is accurate.   Final Clinical Impressions(s) / ED Diagnoses   Final diagnoses:  Syncope, unspecified syncope type    New Prescriptions Discharge Medication List as of 09/10/2015  5:46 AM       Gilda Creasehristopher J Pollina, MD 09/10/15 [phone]

## 2015-10-14 ENCOUNTER — Encounter (HOSPITAL_COMMUNITY): Payer: Self-pay

## 2015-10-14 ENCOUNTER — Emergency Department (HOSPITAL_COMMUNITY)
Admission: EM | Admit: 2015-10-14 | Discharge: 2015-10-14 | Disposition: A | Discharge status: Home / Self Care | Payer: Self-pay | Attending: Emergency Medicine | Admitting: Emergency Medicine

## 2015-10-14 DIAGNOSIS — L03811 Cellulitis of head [any part, except face]: Secondary | ICD-10-CM | POA: Insufficient documentation

## 2015-10-14 MED ORDER — CLINDAMYCIN HCL 150 MG PO CAPS: 450.0000 mg | ORAL_CAPSULE | Freq: Three times a day (TID) | ORAL | 0 refills | Status: DC

## 2015-10-14 NOTE — ED Triage Notes (Signed)
Pt reports rash on his head. He was seen for same 1 month ago, given anti-fungal shampoo and d/c. Pt reports the shampoo helped but now the rash is back. Pt reports last time he was told he may need abx.

## 2015-10-14 NOTE — Discharge Instructions (Signed)
Continue using the ketoconazole shampoo.  Start taking Clindamycin 3 times daily for bacterial infection.  Follow up with Dermatology.  Return if you experience fever, pus drainage, on new lesions.

## 2015-10-14 NOTE — ED Provider Notes (Signed)
[facility]-EMERG[facility]Y DEPT Provider Note   CSN: [id number] Arrival date & time: 10/14/15  0820     History   Chief Complaint Chief Complaint  Patient presents with  . Rash    HPI Edwin Garcia is a 35 y.o. male.  HPI Patient presents with persistent rash to posterior scalp since June of this year.  Associated symptoms include pruritis and erythema.  No drainage.  No fevers, chills, N/V.  He has been using ketoconazole shampoo with some relief; however, it never went away.  He denies any new soaps, lotions, or detergents; however, prior to rash onset, he had his hair done and they did some sort of new scrub.  Past Medical History:  Diagnosis Date  . Allergy   . APHTHOUS ULCERS 05/30/2009  . CHALAZION, LEFT 05/30/2009  . DEPRESSION 11/01/2008  . Eating disorder   . Panic attack   . Syphilis     Patient Active Problem List   Diagnosis Date Noted  . Genital herpes 11/02/2011  . History of syphilis 08/18/2011  . CHALAZION, LEFT 05/30/2009  . APHTHOUS ULCERS 05/30/2009  . DEPRESSION 11/01/2008    Past Surgical History:  Procedure Laterality Date  . ANKLE SURGERY  1999   RIGHT  . TONSILLECTOMY     REMOVED AT 35 YRS OLD       Home Medications    Prior to Admission medications   Medication Sig Start Date End Date Taking? Authorizing Provider  acyclovir (ZOVIRAX) 800 MG tablet Take 1 tablet (800 mg total) by mouth 2 (two) times daily. Patient taking differently: Take 800 mg by mouth 2 (two) times daily as needed (outbreak).  06/25/15   Heather Laisure, PA-C  clindamycin (CLEOCIN) 150 MG capsule Take 3 capsules (450 mg total) by mouth 3 (three) times daily. 10/14/15   Cheri Fowler, PA-C  ketoconazole (NIZORAL) 2 % shampoo Apply 1 application topically 2 (two) times a week. Use for 3 weeks.  Leave shampoo on for 3-5 minutes before rinsing. 06/25/15   Santiago Glad, PA-C    Family History Family History  Problem Relation Age of Onset  . Hypertension Maternal Grandmother 9    . Diabetes Maternal Grandfather 70    TYPE II  . Hypertension Maternal Grandfather 45  . Alcohol abuse Neg Hx     family  . Arthritis Neg Hx     family  . Cancer Neg Hx     family hx - breast ,lung,colon,prostate ca  . Depression Neg Hx     family    Social History Social History  Substance Use Topics  . Smoking status: Never Smoker  . Smokeless tobacco: Never Used  . Alcohol use No     Comment: ALL DRINKS     Allergies   Cephalexin   Review of Systems Review of Systems All other systems negative unless otherwise stated in HPI   Physical Exam Updated Vital Signs BP 131/80 (BP Location: Right Arm)   Pulse 78   Temp 98.3 F (36.8 C) (Oral)   Resp 18   SpO2 100%   Physical Exam  Constitutional: He is oriented to person, place, and time. He appears well-developed and well-nourished.  HENT:  Head: Normocephalic and atraumatic.  Right Ear: External ear normal.  Left Ear: External ear normal.  Erythematous papules with excoriation to posterior scalp.  No purulent drainage.  No fluctuance.  Mild induration.  Hair follicles are broken.  No clear demarcation.  Eyes: Conjunctivae are normal. No scleral icterus.  Neck:  No tracheal deviation present.  Pulmonary/Chest: Effort normal. No respiratory distress.  Abdominal: He exhibits no distension.  Musculoskeletal: Normal range of motion.  Neurological: He is alert and oriented to person, place, and time.  Skin: Skin is warm and dry.  Psychiatric: He has a normal mood and affect. His behavior is normal.     ED Treatments / Results  Labs (all labs ordered are listed, but only abnormal results are displayed) Labs Reviewed - No data to display  EKG  EKG Interpretation None       Radiology No results found.  Procedures Procedures (including critical care time)  Medications Ordered in ED Medications - No data to display   Initial Impression / Assessment and Plan / ED Course  I have reviewed the triage  vital signs and the nursing notes.  Pertinent labs & imaging results that were available during my care of the patient were reviewed by me and considered in my medical decision making (see chart for details).  Clinical Course   Patient presentation consistent with cellulitis. Afebrile. No tachycardia, hypotension or other symptoms suggestive of severe infection. Will discharge with clindamycin. Return precautions discussed. Pt appears safe for discharge.    Final Clinical Impressions(s) / ED Diagnoses   Final diagnoses:  Cellulitis of head except face    New Prescriptions Discharge Medication List as of 10/14/2015 10:42 AM    START taking these medications   Details  clindamycin (CLEOCIN) 150 MG capsule Take 3 capsules (450 mg total) by mouth 3 (three) times daily., Starting Tue 10/14/2015, Print         Cheri FowlerKayla Tyshauna Finkbiner, PA-C 10/14/15 1545    Marily MemosJason Mesner, MD 10/15/15 [id number]

## 2015-11-19 ENCOUNTER — Encounter: Payer: Self-pay | Admitting: Family Medicine

## 2015-11-19 ENCOUNTER — Ambulatory Visit (INDEPENDENT_AMBULATORY_CARE_PROVIDER_SITE_OTHER): Payer: Managed Care, Other (non HMO) | Admitting: Family Medicine

## 2015-11-19 VITALS — BP 122/88 | HR 94 | Temp 98.1°F | Ht 64.75 in | Wt 188.6 lb

## 2015-11-19 DIAGNOSIS — L729 Follicular cyst of the skin and subcutaneous tissue, unspecified: Secondary | ICD-10-CM

## 2015-11-19 DIAGNOSIS — R55 Syncope and collapse: Secondary | ICD-10-CM | POA: Diagnosis not present

## 2015-11-19 DIAGNOSIS — A6000 Herpesviral infection of urogenital system, unspecified: Secondary | ICD-10-CM | POA: Diagnosis not present

## 2015-11-19 DIAGNOSIS — Z23 Encounter for immunization: Secondary | ICD-10-CM | POA: Diagnosis not present

## 2015-11-19 DIAGNOSIS — E669 Obesity, unspecified: Secondary | ICD-10-CM

## 2015-11-19 DIAGNOSIS — I447 Left bundle-branch block, unspecified: Secondary | ICD-10-CM | POA: Diagnosis not present

## 2015-11-19 DIAGNOSIS — L0390 Cellulitis, unspecified: Secondary | ICD-10-CM

## 2015-11-19 DIAGNOSIS — Z206 Contact with and (suspected) exposure to human immunodeficiency virus [HIV]: Secondary | ICD-10-CM

## 2015-11-19 DIAGNOSIS — E66811 Obesity, class 1: Secondary | ICD-10-CM | POA: Insufficient documentation

## 2015-11-19 MED ORDER — VALACYCLOVIR HCL 500 MG PO TABS: ORAL_TABLET | ORAL | 5 refills | Status: DC

## 2015-11-19 NOTE — Assessment & Plan Note (Signed)
S: no outbreaks in 4-5 months but before that had several back to back outbreaks. Acyclovir has helped- was on this as didn't have insurance but thinks valtrex more effective A/P: switch back to valtrex- 500mg  BID for 3 days for outbreaks

## 2015-11-19 NOTE — Progress Notes (Signed)
Phone: [phone]  Subjective:  Patient presents today to establish care. Chief complaint-noted.   See problem oriented charting  The following were reviewed and entered/updated in epic: Past Medical History:  Diagnosis Date  . Allergy   . APHTHOUS ULCERS 05/30/2009  . CHALAZION, LEFT 05/30/2009  . DEPRESSION 11/01/2008  . Eating disorder   . Panic attack   . Syphilis    Patient Active Problem List   Diagnosis Date Noted  . Genital herpes 11/02/2011  . History of syphilis 08/18/2011  . CHALAZION, LEFT 05/30/2009  . APHTHOUS ULCERS 05/30/2009  . DEPRESSION 11/01/2008   Past Surgical History:  Procedure Laterality Date  . ANKLE SURGERY  1999   RIGHT  . TONSILLECTOMY     REMOVED AT 35 YRS OLD    Family History  Problem Relation Age of Onset  . Hypertension Maternal Grandmother 5333  . Diabetes Maternal Grandfather 70    TYPE II  . Hypertension Maternal Grandfather 45  . Alcohol abuse Neg Hx     family  . Arthritis Neg Hx     family  . Cancer Neg Hx     family hx - breast ,lung,colon,prostate ca  . Depression Neg Hx     family    Medications- reviewed and updated Current Outpatient Prescriptions  Medication Sig Dispense Refill  . acyclovir (ZOVIRAX) 800 MG tablet Take 1 tablet (800 mg total) by mouth 2 (two) times daily. (Patient taking differently: Take 800 mg by mouth 2 (two) times daily as needed (outbreak). ) 10 tablet 2  . ketoconazole (NIZORAL) 2 % shampoo Apply 1 application topically 2 (two) times a week. Use for 3 weeks.  Leave shampoo on for 3-5 minutes before rinsing. 120 mL 0   Allergies-reviewed and updated Allergies  Allergen Reactions  . Cephalexin Nausea Only    headache    Social History   Social History  . Marital status: Single    Spouse name: N/A  . Number of children: N/A  . Years of education: N/A   Occupational History  . [employer]   Social History Main Topics  . Smoking status: Never Smoker  . Smokeless  tobacco: Never Used  . Alcohol use No     Comment: ALL DRINKS  . Drug use: No  . 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 None   Social History Narrative   EXERCISE AT GYM - CARDIO ONCE/WEEK/1 HOUR    ROS--Full ROS was completed Review of Systems  Constitutional: Negative for chills and fever.  HENT: Negative for ear pain, hearing loss and tinnitus.   Eyes: Negative for blurred vision and double vision.  Respiratory: Negative for cough and hemoptysis.   Cardiovascular: Negative for chest pain and palpitations.  Gastrointestinal: Negative for heartburn and nausea.  Genitourinary: Negative for dysuria and urgency.  Musculoskeletal: Negative for myalgias and neck pain.  Skin: Positive for itching and rash (scalp).  Neurological: Negative for dizziness and tingling.  Endo/Heme/Allergies: Negative for polydipsia. Does not bruise/bleed easily.  Psychiatric/Behavioral: Negative for substance abuse and suicidal ideas.   Objective: BP 122/88   Pulse 94   Temp 98.1 F (36.7 C) (Oral)   Ht 5' 4.75" (1.645 m)   Wt 188 lb 9.6 oz (85.5 kg)   SpO2 96%   BMI 31.63 kg/m  Gen: NAD, resting comfortably HEENT: Mucous  membranes are moist. Oropharynx normal. TM normal. Eyes: sclera and lids normal, PERRLA Neck: no thyromegaly, no cervical lymphadenopathy CV: RRR no murmurs rubs or gallops Lungs: CTAB no crackles, wheeze, rhonchi Abdomen: soft/nontender/nondistended/normal bowel sounds. No rebound or guarding.  Ext: no edema Skin: warm, dry, on left upper scalp patient has at least 3 x 3 cm area with mutiple pores and cottage cheese like discharge noted with lower volumes of purulence as well. Only mild surrounding erythema.  Neuro: 5/5 strength in upper and lower extremities, normal gait, normal reflexes  Assessment/Plan:  Scalp cyst - Plan: Ambulatory referral to  Dermatology Recurrent cellulitis - Plan: Ambulatory referral to Dermatology S: appears to have a chronic cyst on his scalp. Has been there for a year- at times will either bleed or purulence will be expressed from it. Has been treated with clindamycin and later ketoconazole shampoo -both have seemed to calm it down but never improve it completely A/P: refer to dermatology for another opinion- wonder if will have to have removal of the underlying possible cyst or tunneling abscess.   LBBB (left bundle branch block) Syncope S: Patient had syncopal event after panic attack symptoms (shortness of breath and hyperventilation may have led to this). Seen in ED- advised admission but declined. Advised cards follo wup and has not scheduled. No further syncopal episodes.  A/P: Advised patient with the unexplained syncope that he should not drive for 6 months unless approved by cardiology- we will refer at this time and ask for their opinion on further workup.    Exposure to HIV Advised STD testing- declines and states does about 3x a year through health department- discussed option of prophylaxis- he delcines for now. Partner HIV positive- initially says doesn't always use condoms and later states he is regular in use- advised FIRMLY to always use.   Obesity, Class I, BMI 30-34.9 S:Was 89 has lost a lot of weight. May be having plastic surgery. Needs pcp recommendations- prior had infections in folds of skin/pannus but has not recently. Does have discomfort- feels a heaviness from the sagging skin A/P: I told patient I have no record of him being 89 or recent record of complications from pannus. Told him would certainly support surgery if the time comes from the discomfort perspective but I was not fully sure if this would be covered surgery as may be seen as cosmetic. Also spent time on counseling- Encouraged need for healthy eating, regular exercise, weight loss.    Genital herpes S: no outbreaks in  4-5 months but before that had several back to back outbreaks. Acyclovir has helped- was on this as didn't have insurance but thinks valtrex more effective A/P: switch back to valtrex- 500mg  BID for 3 days for outbreaks  advised at least yearly visits   Orders Placed This Encounter  Procedures  . Flu Vaccine QUAD 36+ mos IM   The duration of face-to-face time during this visit was greater than 30 minutes. Greater than 50% of this time was spent in counseling about condoms, risk for HIV transmission, STD testing needs, advisement of cardiolgoy follow up, and in discussion about not driving.   Return precautions advised.  Tana ConchStephen Kroy Sprung, MD

## 2015-11-19 NOTE — Patient Instructions (Signed)
We will call you within a week about your referral to dermatology and cardiology. If you do not hear within 2 weeks, give us a call.   It sounds like you passed out from hyperventilating from panic attack but with changes on EKG- let's get cardiology blessing  I would advise not driving until cardiology follow up then asking their opinion  Valtrex instead of acyclovir  Advise regular condom use. Advise at least every 3 month STD testing since your partner is positive. Could consider medicine to help prevent you from getting HIV as well

## 2015-11-19 NOTE — Progress Notes (Signed)
Pre visit review using our clinic review tool, if applicable. No additional management support is needed unless otherwise documented below in the visit note. 

## 2015-11-19 NOTE — Assessment & Plan Note (Signed)
Syncope S: Patient had syncopal event after panic attack symptoms (shortness of breath and hyperventilation may have led to this). Seen in ED- advised admission but declined. Advised cards follo wup and has not scheduled. No further syncopal episodes.  A/P: Advised patient with the unexplained syncope that he should not drive for 6 months unless approved by cardiology- we will refer at this time and ask for their opinion on further workup.

## 2015-11-19 NOTE — Assessment & Plan Note (Addendum)
Advised STD testing- declines and states does about 3x a year through health department- discussed option of prophylaxis- he delcines for now. Partner HIV positive- initially says doesn't always use condoms and later states he is regular in use- advised FIRMLY to always use.

## 2015-11-19 NOTE — Assessment & Plan Note (Addendum)
S:Was 89 has lost a lot of weight. May be having plastic surgery. Needs pcp recommendations- prior had infections in folds of skin/pannus but has not recently. Does have discomfort- feels a heaviness from the sagging skin A/P: I told patient I have no record of him being 89 or recent record of complications from pannus. Told him would certainly support surgery if the time comes from the discomfort perspective but I was not fully sure if this would be covered surgery as may be seen as cosmetic. Also spent time on counseling- Encouraged need for healthy eating, regular exercise, weight loss.

## 2015-11-24 ENCOUNTER — Telehealth: Payer: Self-pay | Admitting: Family Medicine

## 2015-11-24 NOTE — Telephone Encounter (Signed)
I did not realize he was not a patient of mine.  I had seen him in past but Dr Durene CalHunter is listed as primary.  Would need to get approval from Dr Durene CalHunter if he has been re-assigned.

## 2015-11-24 NOTE — Telephone Encounter (Signed)
Pt would like to know if you will accept him as a pt? °

## 2015-11-25 NOTE — Telephone Encounter (Signed)
I am confused. I literally just saw him on 11/19/15 to establish care. He is wanting to transfer back to Dr. Caryl NeverBurchette?

## 2015-11-25 NOTE — Telephone Encounter (Signed)
OK 

## 2015-11-25 NOTE — Telephone Encounter (Signed)
Yes the pt state that he would like to see Dr. Caryl NeverBurchette if that was okay with Dr. Caryl NeverBurchette I told him I would have to ask.

## 2015-11-25 NOTE — Telephone Encounter (Signed)
lmom for pt to call the office °

## 2015-11-25 NOTE — Telephone Encounter (Signed)
I have no problem with the transfer. Up to Dr. Caryl NeverBurchette

## 2015-11-26 ENCOUNTER — Ambulatory Visit (INDEPENDENT_AMBULATORY_CARE_PROVIDER_SITE_OTHER): Payer: Managed Care, Other (non HMO) | Admitting: Family Medicine

## 2015-11-26 ENCOUNTER — Encounter: Payer: Self-pay | Admitting: Family Medicine

## 2015-11-26 VITALS — BP 120/90 | HR 71 | Temp 97.8°F | Ht 64.75 in | Wt 189.7 lb

## 2015-11-26 DIAGNOSIS — L03811 Cellulitis of head [any part, except face]: Secondary | ICD-10-CM | POA: Diagnosis not present

## 2015-11-26 DIAGNOSIS — L729 Follicular cyst of the skin and subcutaneous tissue, unspecified: Secondary | ICD-10-CM

## 2015-11-26 MED ORDER — DOXYCYCLINE HYCLATE 100 MG PO CAPS: ORAL_CAPSULE | ORAL | 2 refills | Status: DC

## 2015-11-26 NOTE — Patient Instructions (Signed)
Keep scalp clean with good anti-bacterial soap once daily.

## 2015-11-26 NOTE — Progress Notes (Signed)
Pre visit review using our clinic review tool, if applicable. No additional management support is needed unless otherwise documented below in the visit note. 

## 2015-11-26 NOTE — Progress Notes (Signed)
Subjective:     Patient ID: Edwin Garcia, male   DOB: 03-15-1980, 35 y.o.   MRN: [id number]  HPI Patient seen with what is been a recurring problem which is recurrent cellulitis and abscess of scalp. He has known history of sebaceous cysts and has multiple areas which are currently inflamed. Was seen here recently and referred to dermatology but cannot get appointment until December 26. He has had recent areas that are draining purulence, scalp. He has previously been treated with Nizoral shampoo and clindamycin which helped briefly. No known history of MRSA. Previous intolerance with Keflex.  Past Medical History:  Diagnosis Date  . Allergy   . APHTHOUS ULCERS 05/30/2009  . Bulimia    younger adult  . CHALAZION, LEFT 05/30/2009  . DEPRESSION 11/01/2008  . Panic attack   . Syphilis    Past Surgical History:  Procedure Laterality Date  . ANKLE SURGERY  1999   RIGHT  . TONSILLECTOMY     REMOVED AT 35 YRS OLD    reports that he has never smoked. He has never used smokeless tobacco. He reports that he drinks alcohol. He reports that he does not use drugs. family history includes Cirrhosis in his paternal grandfather; Diabetes in his maternal grandmother; Diabetes (age of onset: 6370) in his maternal grandfather; Healthy in his mother; Hypertension in his paternal grandmother; Hypertension (age of onset: 4233) in his maternal grandmother; Hypertension (age of onset: 5345) in his maternal grandfather; Other in his father. Allergies  Allergen Reactions  . Cephalexin Nausea Only    headache     Review of Systems  Constitutional: Negative for chills and fever.  Hematological: Negative for adenopathy.       Objective:   Physical Exam  Constitutional: He appears well-developed and well-nourished.  Cardiovascular: Normal rate and regular rhythm.   Skin:  Patient appears to have multiple epidermal cysts on the scalp and multiple current areas of inflammation some with erythema and  tenderness. He has several areas that are draining purulence       Assessment:     Patient has multiple epidermal inclusion cyst on the scalp with secondary infection with cellulitis and abscess. This is been a long time recurrent problem    Plan:     -Clean skin daily with antibacterial soap such as Dial -Start doxycycline 100 mg twice a day for 10 days then decrease to once daily -Reassess in 3 weeks -We explained that resolution of this if not managed by antibiotics might involve surgical intervention but this would be very complicated with large area involved  Kristian CoveyBruce W Zofia Peckinpaugh MD [facility]

## 2015-12-02 ENCOUNTER — Telehealth: Payer: Self-pay | Admitting: Family Medicine

## 2015-12-02 NOTE — Telephone Encounter (Signed)
Please advise patient of recommendation for cardiology follow up. If he declines- would strongly encourage him to follow up with Dr. Caryl NeverBurchette who he desired to change to.

## 2015-12-02 NOTE — Telephone Encounter (Signed)
-----   Message from Fayrene HelperWilliam W Yancey sent at 12/02/2015 11:29 AM EST ----- Hello,  We have attempted to reach patient three times with no attempt to schedule patient an appt to see a cardiologist per [facility] referral. Patient [facility] now [facility]. Thanks

## 2015-12-04 NOTE — Telephone Encounter (Signed)
Called and left a voicemail message asking for a return phone call 

## 2015-12-08 NOTE — Telephone Encounter (Signed)
Called and left a voicemail message asking for a return phone call 

## 2015-12-16 NOTE — Telephone Encounter (Signed)
Edwin Garcia, this is the patient that we spoke about. If you could call him and encourage him to follow through with the cardiology appointment. He is transferring over to Dr. Leonard SchwartzB. Thanks!

## 2015-12-22 NOTE — Telephone Encounter (Signed)
Tried calling pt with NA. 

## 2015-12-25 NOTE — Telephone Encounter (Signed)
Letter mailed to pt to contact office

## 2016-01-28 ENCOUNTER — Encounter: Payer: Self-pay | Admitting: Family Medicine

## 2016-01-28 ENCOUNTER — Ambulatory Visit (INDEPENDENT_AMBULATORY_CARE_PROVIDER_SITE_OTHER): Payer: Managed Care, Other (non HMO) | Admitting: Family Medicine

## 2016-01-28 VITALS — BP 130/100 | HR 89 | Temp 97.5°F | Ht 64.75 in | Wt 193.2 lb

## 2016-01-28 DIAGNOSIS — L03115 Cellulitis of right lower limb: Secondary | ICD-10-CM

## 2016-01-28 MED ORDER — CEPHALEXIN 500 MG PO CAPS: 500.0000 mg | ORAL_CAPSULE | Freq: Three times a day (TID) | ORAL | 0 refills | Status: DC

## 2016-01-28 NOTE — Progress Notes (Signed)
Pre visit review using our clinic review tool, if applicable. No additional management support is needed unless otherwise documented below in the visit note. 

## 2016-01-28 NOTE — Patient Instructions (Signed)

## 2016-01-28 NOTE — Progress Notes (Signed)
Subjective:     Patient ID: Dickey GaveStephen T Humbarger, male   DOB: 09-16-1980, 36 y.o.   MRN: [id number]  HPI Patient seen with some swelling and erythema of the right lower leg. On December 31st he was trying to gather up a couple of puppies and slipped and fell against a stair. He had a superficial laceration of anterior leg. He has what sounds like a small hematoma and some bruising but over the past few days had some mild erythema of the lower leg and warmth and mild tenderness. Denies any fevers or chills. He is currently taking the doxycycline regularly twice daily. He has prior history of intolerance with nausea with but he does not recall this at all and is fairly certain he's been taking this without any major issues. Never had any skin rash  Past Medical History:  Diagnosis Date  . Allergy   . APHTHOUS ULCERS 05/30/2009  . Bulimia    younger adult  . CHALAZION, LEFT 05/30/2009  . DEPRESSION 11/01/2008  . Panic attack   . Syphilis    Past Surgical History:  Procedure Laterality Date  . ANKLE SURGERY  1999   RIGHT  . TONSILLECTOMY     REMOVED AT 36 YRS OLD    reports that he has never smoked. He has never used smokeless tobacco. He reports that he drinks alcohol. He reports that he does not use drugs. family history includes Cirrhosis in his paternal grandfather; Diabetes in his maternal grandmother; Diabetes (age of onset: 2970) in his maternal grandfather; Healthy in his mother; Hypertension in his paternal grandmother; Hypertension (age of onset: 6933) in his maternal grandmother; Hypertension (age of onset: 7345) in his maternal grandfather; Other in his father. Allergies  Allergen Reactions  . Cephalexin Nausea Only    headache     Review of Systems  Constitutional: Negative for chills and fever.       Objective:   Physical Exam  Constitutional: He appears well-developed and well-nourished.  Cardiovascular: Normal rate and regular rhythm.   Pulmonary/Chest: Effort normal and  breath sounds normal. No respiratory distress. He has no wheezes. He has no rales.  Skin:  She has superficial laceration anterior right leg with eschar and that appears to be healing well. There is no fluctuance. He has some mild ecchymosis inferior to this. Along the lower leg medially has area approximately 4 x 6 cm of mild erythema and mild tenderness and warmth       Assessment:     Mild cellulitis right leg. Patient already on doxycycline    Plan:     -Add Keflex 500 milligrams 3 times a day for 1 week for better strep coverage -Elevate legs frequently -Apply heat several times daily  Kristian CoveyBruce W Reilyn Nelson MD [facility]

## 2016-02-03 ENCOUNTER — Encounter: Payer: Self-pay | Admitting: Family Medicine

## 2016-02-03 ENCOUNTER — Ambulatory Visit (INDEPENDENT_AMBULATORY_CARE_PROVIDER_SITE_OTHER): Payer: Managed Care, Other (non HMO) | Admitting: Family Medicine

## 2016-02-03 VITALS — BP 118/84 | HR 90 | Ht 64.75 in | Wt 194.0 lb

## 2016-02-03 DIAGNOSIS — Z0000 Encounter for general adult medical examination without abnormal findings: Secondary | ICD-10-CM | POA: Diagnosis not present

## 2016-02-03 LAB — BASIC METABOLIC PANEL
BUN: 11 mg/dL (ref 6–23)
CALCIUM: 9.2 mg/dL (ref 8.4–10.5)
CO2: 29 meq/L (ref 19–32)
Chloride: 102 mEq/L (ref 96–112)
Creatinine, Ser: 0.82 mg/dL (ref 0.40–1.50)
GFR: 138.95 mL/min (ref >60.00)
GLUCOSE: 89 mg/dL (ref 70–99)
Potassium: 4.5 mEq/L (ref 3.5–5.1)
Sodium: 137 mEq/L (ref 135–145)

## 2016-02-03 LAB — HEPATIC FUNCTION PANEL
ALBUMIN: 3.9 g/dL (ref 3.5–5.2)
ALK PHOS: 76 U/L (ref 39–117)
ALT: 16 U/L (ref 0–53)
AST: 17 U/L (ref 0–37)
BILIRUBIN DIRECT: 0.1 mg/dL (ref 0.0–0.3)
Total Bilirubin: 0.7 mg/dL (ref 0.2–1.2)
Total Protein: 6.7 g/dL (ref 6.0–8.3)

## 2016-02-03 LAB — CBC WITH DIFFERENTIAL/PLATELET
BASOS ABS: 0 10*3/uL (ref 0.0–0.1)
Basophils Relative: 0.4 % (ref 0.0–3.0)
EOS PCT: 1.6 % (ref 0.0–5.0)
Eosinophils Absolute: 0.1 10*3/uL (ref 0.0–0.7)
HCT: 42.8 % (ref 39.0–52.0)
HEMOGLOBIN: 14.6 g/dL (ref 13.0–17.0)
Lymphocytes Relative: 27.6 % (ref 12.0–46.0)
Lymphs Abs: 1.5 10*3/uL (ref 0.7–4.0)
MCHC: 34.2 g/dL (ref 30.0–36.0)
MCV: 89.3 fl (ref 78.0–100.0)
MONO ABS: 0.6 10*3/uL (ref 0.1–1.0)
MONOS PCT: 10.7 % (ref 3.0–12.0)
Neutro Abs: 3.3 10*3/uL (ref 1.4–7.7)
Neutrophils Relative %: 59.7 % (ref 43.0–77.0)
Platelets: 274 10*3/uL (ref 150.0–400.0)
RBC: 4.79 Mil/uL (ref 4.22–5.81)
RDW: 14.5 % (ref 11.5–15.5)
WBC: 5.4 10*3/uL (ref 4.0–10.5)

## 2016-02-03 LAB — LIPID PANEL
CHOL/HDL RATIO: 3
Cholesterol: 196 mg/dL (ref 0–200)
HDL: 60 mg/dL (ref >39.00)
LDL Cholesterol: 121 mg/dL — ABNORMAL HIGH (ref 0–99)
NONHDL: 136.04
Triglycerides: 74 mg/dL (ref 0.0–149.0)
VLDL: 14.8 mg/dL (ref 0.0–40.0)

## 2016-02-03 LAB — TSH: TSH: 1.19 u[IU]/mL (ref 0.35–4.50)

## 2016-02-03 NOTE — Progress Notes (Signed)
Subjective:     Patient ID: Edwin Garcia, male   DOB: 07-02-1980, 36 y.o.   MRN: [id number]  HPI And seen for physical exam. He currently only takes doxycycline for recurrent folliculitis of scalp. That seems to be controlling things fairly well. He has history of morbid obesity. Back in 2005 his weight was 265 pounds. His weight has fluctuated greatly 245 pounds and current weight of 194 pounds over the past few years. He is working 2 jobs. Inconsistent exercise. He has redundant skin from previous obesity is looking at plastic surgery to get that corrected. Tetanus up-to-date. Influenza vaccine given. Recent cellulitis right leg improved after addition of Keflex.  He's had the past one half years same sexual partner. Requesting repeat HIV. Denies any dysuria, skin rashes, or other concerning symptoms  Past Medical History:  Diagnosis Date  . Allergy   . APHTHOUS ULCERS 05/30/2009  . Bulimia    younger adult  . CHALAZION, LEFT 05/30/2009  . DEPRESSION 11/01/2008  . Panic attack   . Syphilis    Past Surgical History:  Procedure Laterality Date  . ANKLE SURGERY  1999   RIGHT  . TONSILLECTOMY     REMOVED AT 36 YRS OLD    reports that he has never smoked. He has never used smokeless tobacco. He reports that he drinks alcohol. He reports that he does not use drugs. family history includes Cirrhosis in his paternal grandfather; Diabetes in his maternal grandmother; Diabetes (age of onset: 4870) in his maternal grandfather; Healthy in his mother; Hypertension in his paternal grandmother; Hypertension (age of onset: 8033) in his maternal grandmother; Hypertension (age of onset: 5245) in his maternal grandfather; Other in his father. Allergies  Allergen Reactions  . Cephalexin Nausea Only    headache     Review of Systems  Constitutional: Negative for activity change, appetite change, fatigue and fever.  HENT: Negative for congestion, ear pain and trouble swallowing.   Eyes: Negative for  pain and visual disturbance.  Respiratory: Negative for cough, shortness of breath and wheezing.   Cardiovascular: Negative for chest pain and palpitations.  Gastrointestinal: Negative for abdominal distention, abdominal pain, blood in stool, constipation, diarrhea, nausea, rectal pain and vomiting.  Genitourinary: Negative for dysuria, hematuria and testicular pain.  Musculoskeletal: Negative for arthralgias and joint swelling.  Skin: Negative for rash.  Neurological: Negative for dizziness, syncope and headaches.  Hematological: Negative for adenopathy.  Psychiatric/Behavioral: Negative for confusion and dysphoric mood.       Objective:   Physical Exam  Constitutional: He is oriented to person, place, and time. He appears well-developed and well-nourished. No distress.  HENT:  Head: Normocephalic and atraumatic.  Right Ear: External ear normal.  Left Ear: External ear normal.  Mouth/Throat: Oropharynx is clear and moist.  Eyes: Conjunctivae and EOM are normal. Pupils are equal, round, and reactive to light.  Neck: Normal range of motion. Neck supple. No thyromegaly present.  Cardiovascular: Normal rate, regular rhythm and normal heart sounds.   No murmur heard. Pulmonary/Chest: No respiratory distress. He has no wheezes. He has no rales.  Abdominal: Soft. Bowel sounds are normal. He exhibits no distension and no mass. There is no tenderness. There is no rebound and no guarding.  Lymphadenopathy:    He has no cervical adenopathy.  Neurological: He is alert and oriented to person, place, and time. He displays normal reflexes. No cranial nerve deficit.  Skin: No rash noted.  Psychiatric: He has a normal mood and affect.  Assessment:     Physical exam. History of morbid obesity as above    Plan:     -Obtain labs. Include HIV screen per patient request -We discussed healthy lifestyle issues and achieving and maintaining appropriate weight -Scale back calorie intake from  alcohol -Try to establish more consistent exercise  Kristian Covey MD [facility]

## 2016-02-03 NOTE — Progress Notes (Signed)
Pre visit review using our clinic review tool, if applicable. No additional management support is needed unless otherwise documented below in the visit note. 

## 2016-02-04 LAB — HIV ANTIBODY (ROUTINE TESTING W REFLEX): HIV 1&2 Ab, 4th Generation: NONREACTIVE

## 2017-01-26 ENCOUNTER — Encounter: Payer: Self-pay | Admitting: Family Medicine

## 2017-01-26 ENCOUNTER — Ambulatory Visit (INDEPENDENT_AMBULATORY_CARE_PROVIDER_SITE_OTHER): Payer: 59 | Admitting: Family Medicine

## 2017-01-26 VITALS — BP 110/80 | HR 82 | Temp 97.7°F | Wt 206.3 lb

## 2017-01-26 DIAGNOSIS — R0781 Pleurodynia: Secondary | ICD-10-CM | POA: Diagnosis not present

## 2017-01-26 DIAGNOSIS — Z113 Encounter for screening for infections with a predominantly sexual mode of transmission: Secondary | ICD-10-CM | POA: Diagnosis not present

## 2017-01-26 DIAGNOSIS — M546 Pain in thoracic spine: Secondary | ICD-10-CM | POA: Diagnosis not present

## 2017-01-26 MED ORDER — METHOCARBAMOL 500 MG PO TABS: 500.0000 mg | ORAL_TABLET | Freq: Four times a day (QID) | ORAL | 0 refills | Status: DC | PRN

## 2017-01-26 NOTE — Progress Notes (Signed)
Subjective:     Patient ID: Edwin Garcia, male   DOB: 05-Nov-1980, 37 y.o.   MRN: [id number]  HPI Patient for the following issues  Discuss STD screening. He's had one partner past couple years and is now an ex partner. He states that his ex-partner was diagnosed with syphilis. Patient was treated 4 years ago for syphilis but has not had any recent symptoms. He would like to be retested. He denies a dysuria. No fevers or chills. No skin rashes. No adenopathy.  Has had multiple partners in the past but one stable partner past couple years. He has considered preexposure prophylaxis but has never really pursued this in the past  Right rib cage and back pain. He states he had some back pain couple weeks ago and he had his mother stepped onto his back region (per pt request) and since that time has had some right lateral rib cage pain. No pleuritic pain. No cough. Pain is mild to moderate. Worse with movement. He feels muscles are "stiffening up" in his back  Past Medical History:  Diagnosis Date  . Allergy   . APHTHOUS ULCERS 05/30/2009  . Bulimia    younger adult  . CHALAZION, LEFT 05/30/2009  . DEPRESSION 11/01/2008  . Panic attack   . Syphilis    Past Surgical History:  Procedure Laterality Date  . ANKLE SURGERY  1999   RIGHT  . TONSILLECTOMY     REMOVED AT 37 YRS OLD    reports that  has never smoked. he has never used smokeless tobacco. He reports that he drinks alcohol. He reports that he does not use drugs. family history includes Cirrhosis in his paternal grandfather; Diabetes in his maternal grandmother; Diabetes (age of onset: 4070) in his maternal grandfather; Healthy in his mother; Hypertension in his paternal grandmother; Hypertension (age of onset: 6833) in his maternal grandmother; Hypertension (age of onset: 4145) in his maternal grandfather; Other in his father. Allergies  Allergen Reactions  . Cephalexin Nausea Only    headache     Review of Systems  Constitutional:  Negative for appetite change, chills and fever.  Respiratory: Negative for cough and shortness of breath.   Cardiovascular: Negative for palpitations and leg swelling.  Gastrointestinal: Negative for abdominal pain.  Genitourinary: Negative for dysuria.  Skin: Negative for rash.  Hematological: Negative for adenopathy.       Objective:   Physical Exam  Constitutional: He appears well-developed and well-nourished.  Cardiovascular: Normal rate and regular rhythm.  Pulmonary/Chest: Effort normal and breath sounds normal. No respiratory distress. He has no wheezes. He has no rales.  Musculoskeletal: He exhibits no edema.  No spinal tenderness. Mild right lateral rib cage tenderness to palpation. No visible swelling or bruising.       Assessment:     #1 STD screening/counseling.  Exposure to ex-partner who tested positive for syphilis  #2 right rib cage and lower thoracic back pain-suspect musculoskeletal    Plan:     -Robaxin 500 mg daily at bedtime and try some moist heat for symptom relief -Check HIV, RPR, GC and chlamydia. -We discussed issues regarding preexposure prophylaxis ([place]) and he will consider   Kristian CoveyBruce W Prem Coykendall MD [facility]

## 2017-01-26 NOTE — Patient Instructions (Signed)
Emtricitabine; Tenofovir disoproxil fumarate tablets What is this medicine? EMTRICITABINE; TENOFOVIR DISOPROXIL FUMARATE (em tri SIT uh bean; te NOE fo veer) is two antiretroviral medicines in one tablet. It is used with other medicines to treat HIV and with safe sex practices to prevent HIV in high risk persons. This medicine is not a cure for HIV. This medicine may be used for other purposes; ask your health care provider or pharmacist if you have questions. COMMON BRAND NAME(S): Truvada What should I tell my health care provider before I take this medicine? They need to know if you have any of these conditions: -bone problems -kidney disease -liver disease -an unusual or allergic reaction to emtricitabine, tenofovir, other medicines, foods, dyes, or preservatives -pregnant or trying to get pregnant -breast-feeding How should I use this medicine? Take this medicine by mouth with a glass of water. Swallow tablets whole; do not cut, crush or chew. Follow the directions on the prescription label. You can take it with or without food. If it upsets your stomach, take it with food. Take your medicine at regular intervals. Do not take your medicine more often than directed. For your anti-HIV therapy to work as well as possible, take each dose exactly as prescribed. Do not skip doses or stop your medicine even if you feel better. Skipping doses may make the HIV virus resistant to this medicine and other medicines. Do not stop taking except on your doctor's advice. Talk to your pediatrician regarding the use of this medicine in children. While this drug may be prescribed for selected conditions, precautions do apply. Overdosage: If you think you have taken too much of this medicine contact a poison control center or emergency room at once. NOTE: This medicine is only for you. Do not share this medicine with others. What if I miss a dose? If you miss a dose, take it as soon as you can. If it is almost  time for your next dose, take only that dose. Do not take double or extra doses. What may interact with this medicine? Do not take this medicine with any of the following medications: -adefovir -any medicine that contains lamivudine -any medicine that contains emtricitabine or tenofovir This medicine may also interact with the following medications: -atazanavir -didanosine, ddI -lopinavir; ritonavir -medicines for viral infections like cidofovir, acyclovir, valacyclovir, ganciclovir, valganciclovir -saquinavir This list may not describe all possible interactions. Give your health care provider a list of all the medicines, herbs, non-prescription drugs, or dietary supplements you use. Also tell them if you smoke, drink alcohol, or use illegal drugs. Some items may interact with your medicine. What should I watch for while using this medicine? Visit your doctor or health care professional for regular check ups. Discuss any new symptoms with your doctor. You will need to have important blood work done while on this medicine. HIV is spread to others through sexual or blood contact. Talk to your doctor about how to stop the spread of HIV. If you have hepatitis B, talk to your doctor if you plan to stop this medicine. The symptoms of hepatitis B may get worse if you stop this medicine. What side effects may I notice from receiving this medicine? Side effects that you should report to your doctor or health care professional as soon as possible: -allergic reactions like skin rash, itching or hives, swelling of the face, lips, or tongue -breathing difficulties -dark urine -general ill feeling or flu-like symptoms -light-colored stools -loss of appetite -nausea, vomiting, unusual stomach   upset or pain -right upper belly pain -trouble passing urine or change in the amount of urine -unusually weak or tired -yellowing of the eyes or skin Side effects that usually do not require medical attention  (report to your doctor or health care professional if they continue or are bothersome): -diarrhea -dizziness -headache -skin discoloration on the hands or feet This list may not describe all possible side effects. Call your doctor for medical advice about side effects. You may report side effects to FDA at 1-800-FDA-1088. Where should I keep my medicine? Keep out of the reach of children. Store at room temperature between 15 and 30 degrees C (59 and 86 degrees F). Throw away any unused medicine after the expiration date. NOTE: This sheet is a summary. It may not cover all possible information. If you have questions about this medicine, talk to your doctor, pharmacist, or health care provider.  2018 Elsevier/Gold Standard (2015-05-13 09:46:32)  

## 2017-01-27 LAB — HIV ANTIBODY (ROUTINE TESTING W REFLEX): HIV: NONREACTIVE

## 2017-01-27 LAB — RPR: RPR: NONREACTIVE

## 2017-01-27 LAB — C. TRACHOMATIS/N. GONORRHOEAE RNA
C. TRACHOMATIS RNA, TMA: NOT DETECTED
N. gonorrhoeae RNA, TMA: NOT DETECTED

## 2017-05-25 IMAGING — CR DG CHEST 2V
2 series · 2 of 2 positions shown · non-contrast
Comparison: [DATE]

CLINICAL DATA: Syncope

EXAM:
CHEST  2 VIEW

[chest pa]
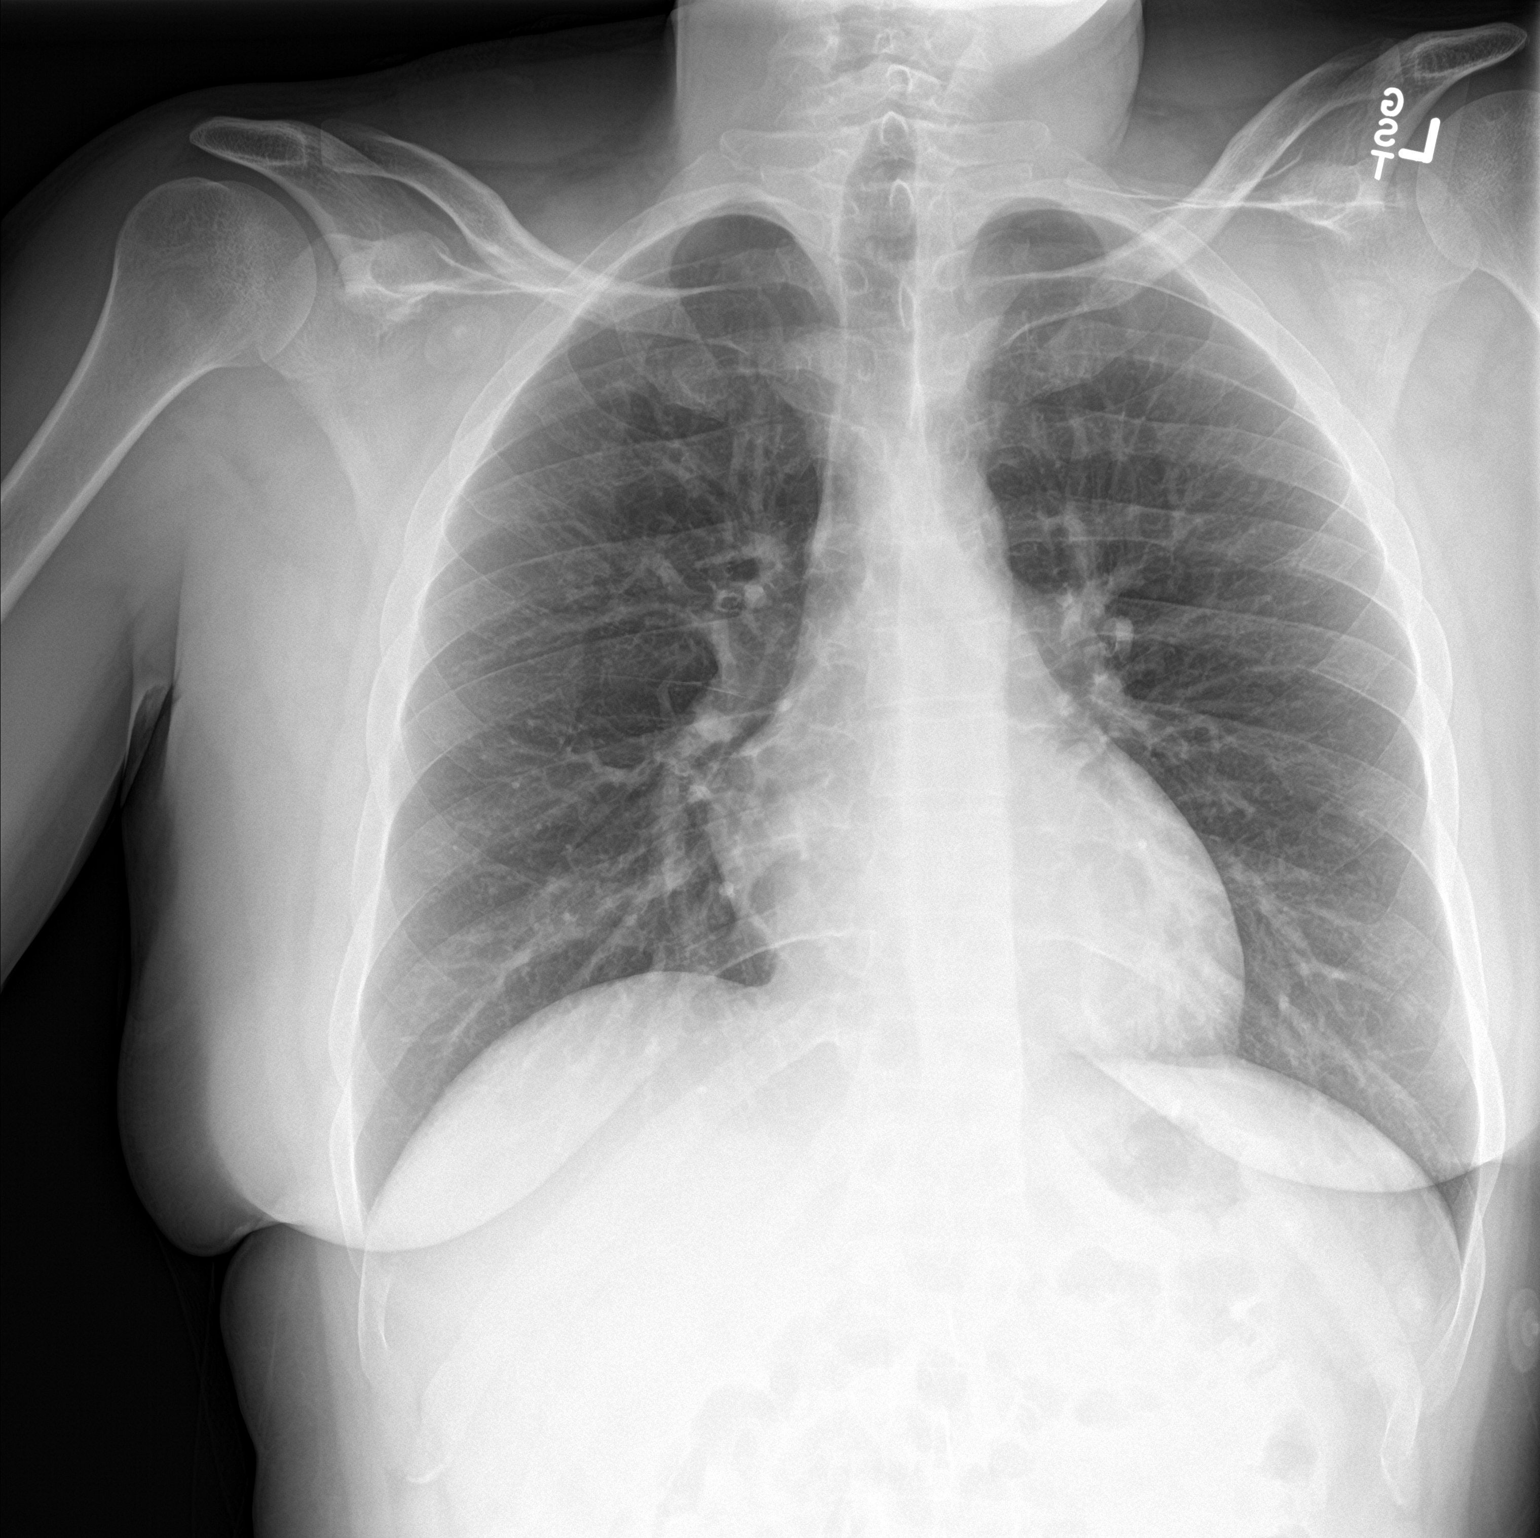

[chest lat]
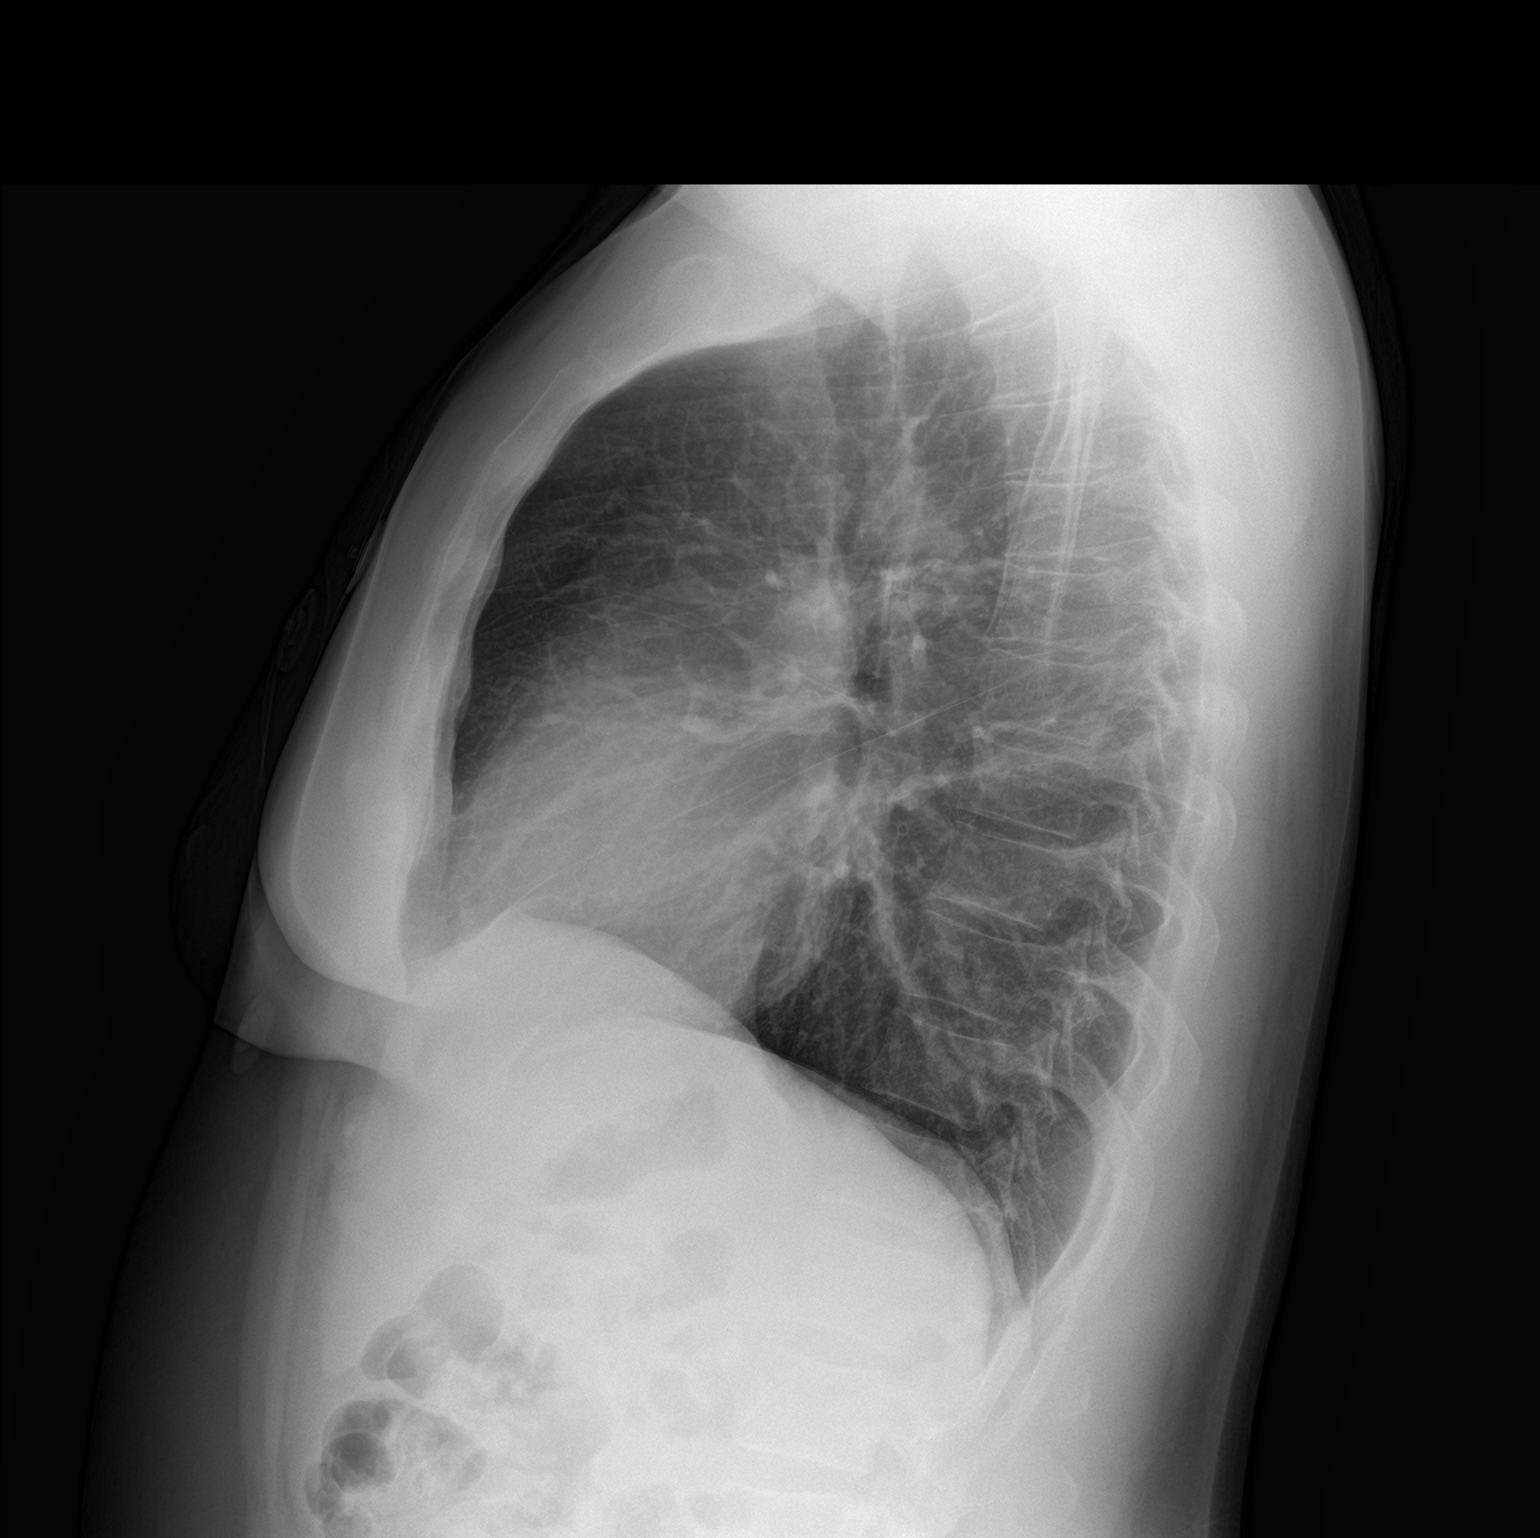

[2 of 2 positions shown; findings below may reference images not displayed]

FINDINGS: Normal heart size and mediastinal contours. No acute infiltrate or
edema. No effusion or pneumothorax. No acute osseous findings.
IMPRESSION: No active cardiopulmonary disease.

## 2017-10-24 ENCOUNTER — Other Ambulatory Visit: Payer: Self-pay

## 2017-10-24 ENCOUNTER — Ambulatory Visit (INDEPENDENT_AMBULATORY_CARE_PROVIDER_SITE_OTHER): Payer: 59 | Admitting: Family Medicine

## 2017-10-24 ENCOUNTER — Encounter: Payer: Self-pay | Admitting: Family Medicine

## 2017-10-24 VITALS — BP 122/78 | HR 84 | Temp 98.4°F | Wt 196.7 lb

## 2017-10-24 DIAGNOSIS — L738 Other specified follicular disorders: Secondary | ICD-10-CM | POA: Diagnosis not present

## 2017-10-24 DIAGNOSIS — A6000 Herpesviral infection of urogenital system, unspecified: Secondary | ICD-10-CM

## 2017-10-24 DIAGNOSIS — Z113 Encounter for screening for infections with a predominantly sexual mode of transmission: Secondary | ICD-10-CM

## 2017-10-24 LAB — HEPATIC FUNCTION PANEL
ALBUMIN: 4.1 g/dL (ref 3.5–5.2)
ALT: 21 U/L (ref 0–53)
AST: 18 U/L (ref 0–37)
Alkaline Phosphatase: 98 U/L (ref 39–117)
Bilirubin, Direct: 0.1 mg/dL (ref 0.0–0.3)
Total Bilirubin: 0.5 mg/dL (ref 0.2–1.2)
Total Protein: 7 g/dL (ref 6.0–8.3)

## 2017-10-24 LAB — BASIC METABOLIC PANEL
BUN: 7 mg/dL (ref 6–23)
CHLORIDE: 102 meq/L (ref 96–112)
CO2: 29 mEq/L (ref 19–32)
Calcium: 9.5 mg/dL (ref 8.4–10.5)
Creatinine, Ser: 0.88 mg/dL (ref 0.40–1.50)
GFR: 126.77 mL/min (ref >60.00)
Glucose, Bld: 87 mg/dL (ref 70–99)
POTASSIUM: 4.4 meq/L (ref 3.5–5.1)
Sodium: 138 mEq/L (ref 135–145)

## 2017-10-24 MED ORDER — VALACYCLOVIR HCL 500 MG PO TABS: ORAL_TABLET | ORAL | 5 refills | Status: DC

## 2017-10-24 MED ORDER — DOXYCYCLINE HYCLATE 100 MG PO CAPS: 100.0000 mg | ORAL_CAPSULE | Freq: Two times a day (BID) | ORAL | 1 refills | Status: DC

## 2017-10-24 NOTE — Progress Notes (Signed)
  Subjective:     Patient ID: Edwin Garcia, male   DOB: [birth year], 37 y.o.   MRN: [id number]  HPI Patient here for several issues as follows  Requesting STD screening.  He has had multiple partners in the past.  He does have remote history of syphilis.  He has history of genital herpes.  Specifically would like to consider preexposure prophylaxis with Truvada.  He does not practice receptive anal intercourse.  Denies any skin rash.  No fever.  No dysuria.  Requesting STD screening.  Has sex with other men.  Previous HIV screening negative.    History of recurrent folliculitis/cellulitis involving posterior scalp.  Has responded well with doxycycline in the past and requesting refills.  Also requests refill of Valtrex which he takes as needed for recurrence of genital herpes.  He does not take this prophylactically  Past Medical History:  Diagnosis Date  . Allergy   . APHTHOUS ULCERS 05/30/2009  . Bulimia    younger adult  . CHALAZION, LEFT 05/30/2009  . DEPRESSION 11/01/2008  . Panic attack   . Syphilis    Past Surgical History:  Procedure Laterality Date  . ANKLE SURGERY  1999   RIGHT  . TONSILLECTOMY     REMOVED AT 37 YRS OLD    reports that he has never smoked. He has never used smokeless tobacco. He reports that he drinks alcohol. He reports that he does not use drugs. family history includes Cirrhosis in his paternal grandfather; Diabetes in his maternal grandmother; Diabetes (age of onset: 42) in his maternal grandfather; Healthy in his mother; Hypertension in his paternal grandmother; Hypertension (age of onset: 66) in his maternal grandmother; Hypertension (age of onset: 73) in his maternal grandfather; Other in his father. Allergies  Allergen Reactions  . Cephalexin Nausea Only    headache     Review of Systems  Constitutional: Negative for appetite change, chills, fever and unexpected weight change.  Respiratory: Negative for cough and shortness of breath.    Cardiovascular: Negative for chest pain.  Genitourinary: Negative for discharge, dysuria and genital sores.  Hematological: Negative for adenopathy.       Objective:   Physical Exam  Constitutional: He is oriented to person, place, and time. He appears well-developed and well-nourished.  Neck: Neck supple.  Cardiovascular: Normal rate and regular rhythm.  Pulmonary/Chest: Effort normal and breath sounds normal.  Musculoskeletal: He exhibits no edema.  Lymphadenopathy:    He has no cervical adenopathy.  Neurological: He is alert and oriented to person, place, and time.  Skin: No rash noted.  Psychiatric: He has a normal mood and affect. His behavior is normal.       Assessment:     #1 at risk for STDs.  Prior history of genital herpes.  Patient requesting consideration for preexposure prophylaxis  #2 history of genital herpes  #3 history of recurrent folliculitis involving scalp    Plan:     -Refilled Valtrex and doxycycline for as needed use -Check labs today with HIV, RPR, hepatitis B surface antigen, basic metabolic panel, hepatic panel, urine for GC and chlamydia, and oral GC.  Patient declines rectal swab for GC as he does not practice receptive anal intercourse  Kristian Covey MD [facility]

## 2017-10-25 LAB — RPR: RPR Ser Ql: NONREACTIVE

## 2017-10-25 LAB — HIV ANTIBODY (ROUTINE TESTING W REFLEX): HIV 1&2 Ab, 4th Generation: NONREACTIVE

## 2017-10-25 LAB — C. TRACHOMATIS/N. GONORRHOEAE RNA
C. TRACHOMATIS RNA, TMA: NOT DETECTED
N. GONORRHOEAE RNA, TMA: NOT DETECTED

## 2017-10-25 LAB — HEPATITIS B SURFACE ANTIGEN: Hepatitis B Surface Ag: NONREACTIVE

## 2017-10-27 ENCOUNTER — Telehealth: Payer: Self-pay | Admitting: Family Medicine

## 2017-10-27 NOTE — Telephone Encounter (Signed)
Copied from CRM [id number]. Topic: Quick Communication - See Telephone Encounter >> Oct 27, 2017  4:04 PM Lorrine Kin, NT wrote: CRM for notification. See Telephone encounter for: 10/27/17. Patient calling regarding his test results. States that he has not heard anything back from the office. Please advise.  CB#: [phone]

## 2017-10-28 MED ORDER — EMTRICITABINE-TENOFOVIR DF 200-300 MG PO TABS: 1.0000 | ORAL_TABLET | Freq: Every day | ORAL | 0 refills | Status: DC

## 2017-10-28 NOTE — Telephone Encounter (Signed)
Pt notified of results/instructions and verbalized understanding. 

## 2018-01-30 ENCOUNTER — Ambulatory Visit: Payer: 59 | Admitting: Family Medicine

## 2018-04-18 ENCOUNTER — Telehealth: Payer: Self-pay

## 2018-04-18 NOTE — Telephone Encounter (Signed)
Called patient and I have scheduled him for a WebEx appointment tomorrow at 1:30pm. Lorain Childes

## 2018-04-18 NOTE — Telephone Encounter (Signed)
Copied from CRM [id number]. Topic: General - Other >> Apr 18, 2018 11:14 AM Jaquita Rector A wrote: Reason for CRM: Patient called stated that he think he may have an STD and would like to be seen for some STD testing please. Please advise Ph# [phone]

## 2018-04-19 ENCOUNTER — Ambulatory Visit (INDEPENDENT_AMBULATORY_CARE_PROVIDER_SITE_OTHER): Payer: 59 | Admitting: Family Medicine

## 2018-04-19 ENCOUNTER — Other Ambulatory Visit: Payer: Self-pay

## 2018-04-19 DIAGNOSIS — R21 Rash and other nonspecific skin eruption: Secondary | ICD-10-CM | POA: Diagnosis not present

## 2018-04-19 DIAGNOSIS — A6000 Herpesviral infection of urogenital system, unspecified: Secondary | ICD-10-CM

## 2018-04-19 NOTE — Progress Notes (Signed)
Patient ID: Edwin Garcia, male   DOB: 29-Aug-1980, 38 y.o.   MRN: [id number]   Virtual Visit via Video Note  I connected with Edwin Garcia on 04/19/18 at  1:30 PM EDT by a video enabled telemedicine application and verified that I am speaking with the correct person using two identifiers.  Location patient: home Location provider:work or home office Persons participating in the virtual visit: patient, provider  I discussed the limitations of evaluation and management by telemedicine and the availability of in person appointments. The patient expressed understanding and agreed to proceed.   HPI: Edwin Garcia called and and noted over the weekend a slightly painful blistery rash on his scrotum.  He has history of genital herpes.  He thinks he has had 1 prior occurrence on the scrotum previously.  This is already starting to dry up.  He did not have any actual penile rash.  No burning with urination.  No fevers or chills.  He does have remote history of syphilis.  He does not describe any painless chancre or other rash suspicious for syphilis.  He does have history of high risk sexual behavior.  We had prescribed Truvada but he never started this because of concerns about possible renal dysfunction.   ROS: See pertinent positives and negatives per HPI.  Past Medical History:  Diagnosis Date  . Allergy   . APHTHOUS ULCERS 05/30/2009  . Bulimia    younger adult  . CHALAZION, LEFT 05/30/2009  . DEPRESSION 11/01/2008  . Panic attack   . Syphilis     Past Surgical History:  Procedure Laterality Date  . ANKLE SURGERY  1999   RIGHT  . TONSILLECTOMY     REMOVED AT 38 YRS OLD    Family History  Problem Relation Age of Onset  . Healthy Mother   . Other Father        smoker- lung nodule  . Hypertension Maternal Grandmother 49  . Diabetes Maternal Grandmother   . Diabetes Maternal Grandfather 70       TYPE II  . Hypertension Maternal Grandfather 45  . Hypertension Paternal  Grandmother   . Cirrhosis Paternal Grandfather        cirrhosis alcohol related, also smoker- ? lung cancer  . Alcohol abuse Neg Hx        family  . Arthritis Neg Hx        family  . Cancer Neg Hx        family hx - breast ,lung,colon,prostate ca  . Depression Neg Hx        family    SOCIAL HX: Non-smoker   Current Outpatient Medications:  .  doxycycline (VIBRAMYCIN) 100 MG capsule, Take 1 capsule (100 mg total) by mouth 2 (two) times daily., Disp: 20 capsule, Rfl: 1 .  emtricitabine-tenofovir (TRUVADA) 200-300 MG tablet, Take 1 tablet by mouth daily., Disp: 90 tablet, Rfl: 0 .  ketoconazole (NIZORAL) 2 % shampoo, Apply 1 application topically 2 (two) times a week. Use for 3 weeks.  Leave shampoo on for 3-5 minutes before rinsing., Disp: 120 mL, Rfl: 0 .  methocarbamol (ROBAXIN) 500 MG tablet, Take 1 tablet (500 mg total) by mouth every 6 (six) hours as needed for muscle spasms., Disp: 20 tablet, Rfl: 0 .  valACYclovir (VALTREX) 500 MG tablet, Take one tablet BID for 3 days prn outbreak., Disp: 30 tablet, Rfl: 5  EXAM:  VITALS per patient if applicable:  GENERAL: alert, oriented, appears well and in no acute distress  HEENT: atraumatic, conjunttiva clear, no obvious abnormalities on inspection of external nose and ears  NECK: normal movements of the head and neck  LUNGS: on inspection no signs of respiratory distress, breathing rate appears normal, no obvious gross SOB, gasping or wheezing  CV: no obvious cyanosis  MS: moves all visible extremities without noticeable abnormality  PSYCH/NEURO: pleasant and cooperative, no obvious depression or anxiety, speech and thought processing grossly intact  ASSESSMENT AND PLAN:  Discussed the following assessment and plan:  Patient describes painful blister on the scrotum which is already starting to clear.  Most likely this represents recurrent herpes based on his history  -Continue valacyclovir as needed for flareups.  He is not  having flareups on a frequent enough basis to justify daily valacyclovir  -We did discuss possible use of Descovy for preexposure prophylaxis.  We will discuss at future office visit     I discussed the assessment and treatment plan with the patient. The patient was provided an opportunity to ask questions and all were answered. The patient agreed with the plan and demonstrated an understanding of the instructions.   The patient was advised to call back or seek an in-person evaluation if the symptoms worsen or if the condition fails to improve as anticipated.   Evelena Peat, MD

## 2018-05-31 ENCOUNTER — Other Ambulatory Visit: Payer: Self-pay

## 2018-05-31 ENCOUNTER — Ambulatory Visit (INDEPENDENT_AMBULATORY_CARE_PROVIDER_SITE_OTHER): Payer: 59 | Admitting: Family Medicine

## 2018-05-31 DIAGNOSIS — R002 Palpitations: Secondary | ICD-10-CM | POA: Diagnosis not present

## 2018-05-31 NOTE — Progress Notes (Signed)
Patient ID: Edwin Garcia, male   DOB: 16-Aug-1980, 38 y.o.   MRN: [id number]  This visit type was conducted due to national recommendations for restrictions regarding the COVID-19 pandemic in an effort to limit this patient's exposure and mitigate transmission in our community.   Virtual Visit via Video Note  I connected with [name] on 05/31/18 at  8:00 AM EDT by a video enabled telemedicine application and verified that I am speaking with the correct person using two identifiers.  Location patient: home Location provider:work or home office Persons participating in the virtual visit: patient, provider  I discussed the limitations of evaluation and management by telemedicine and the availability of in person appointments. The patient expressed understanding and agreed to proceed.   HPI: Patient has had some recent palpitations.  They seem to start around last Thursday.  He describes a "fluttering "sensation that seems to be worse at night when he is lying still.  He has noticed a definite correlation with caffeine and alcohol.  He initially describes some dyspnea but states he was really more just him trying to get some deep breaths during episodes.  No exertional dyspnea.  No chest pains.  No syncope.  Also has had some recent increased stress issues.  He denies any palpitations yesterday or today.   ROS: See pertinent positives and negatives per HPI.  Past Medical History:  Diagnosis Date  . Allergy   . APHTHOUS ULCERS 05/30/2009  . Bulimia    younger adult  . CHALAZION, LEFT 05/30/2009  . DEPRESSION 11/01/2008  . Panic attack   . Syphilis     Past Surgical History:  Procedure Laterality Date  . ANKLE SURGERY  1999   RIGHT  . TONSILLECTOMY     REMOVED AT 38 YRS OLD    Family History  Problem Relation Age of Onset  . Healthy Mother   . Other Father        smoker- lung nodule  . Hypertension Maternal Grandmother 102  . Diabetes Maternal Grandmother   . Diabetes  Maternal Grandfather 70       TYPE II  . Hypertension Maternal Grandfather 45  . Hypertension Paternal Grandmother   . Cirrhosis Paternal Grandfather        cirrhosis alcohol related, also smoker- ? lung cancer  . Alcohol abuse Neg Hx        family  . Arthritis Neg Hx        family  . Cancer Neg Hx        family hx - breast ,lung,colon,prostate ca  . Depression Neg Hx        family    SOCIAL HX: Non-smoker.  Does use some alcohol.   Current Outpatient Medications:  .  doxycycline (VIBRAMYCIN) 100 MG capsule, Take 1 capsule (100 mg total) by mouth 2 (two) times daily., Disp: 20 capsule, Rfl: 1 .  emtricitabine-tenofovir (TRUVADA) 200-300 MG tablet, Take 1 tablet by mouth daily., Disp: 90 tablet, Rfl: 0 .  ketoconazole (NIZORAL) 2 % shampoo, Apply 1 application topically 2 (two) times a week. Use for 3 weeks.  Leave shampoo on for 3-5 minutes before rinsing., Disp: 120 mL, Rfl: 0 .  methocarbamol (ROBAXIN) 500 MG tablet, Take 1 tablet (500 mg total) by mouth every 6 (six) hours as needed for muscle spasms., Disp: 20 tablet, Rfl: 0 .  valACYclovir (VALTREX) 500 MG tablet, Take one tablet BID for 3 days prn outbreak., Disp: 30 tablet, Rfl: 5  EXAM:  VITALS per patient if applicable:  GENERAL: alert, oriented, appears well and in no acute distress  HEENT: atraumatic, conjunttiva clear, no obvious abnormalities on inspection of external nose and ears  NECK: normal movements of the head and neck  LUNGS: on inspection no signs of respiratory distress, breathing rate appears normal, no obvious gross SOB, gasping or wheezing  CV: no obvious cyanosis  MS: moves all visible extremities without noticeable abnormality  PSYCH/NEURO: pleasant and cooperative, no obvious depression or anxiety, speech and thought processing grossly intact  ASSESSMENT AND PLAN:  Discussed the following assessment and plan:  Palpitations-he is describing palpitations triggered by alcohol and caffeine.   Suspect probably symptomatic PVCs-or PACs  -We discussed that if these persist or worsen recommend office follow-up to start with repeat EKG and may need to consider cardiac monitor -Reduce caffeine and alcohol consumption -Follow-up immediately for any chest pain, syncope, dizziness, or associated dyspnea     I discussed the assessment and treatment plan with the patient. The patient was provided an opportunity to ask questions and all were answered. The patient agreed with the plan and demonstrated an understanding of the instructions.   The patient was advised to call back or seek an in-person evaluation if the symptoms worsen or if the condition fails to improve as anticipated.     Evelena PeatBruce Barbarajean Kinzler, MD

## 2018-09-11 ENCOUNTER — Ambulatory Visit: Payer: Self-pay | Admitting: *Deleted

## 2018-09-11 NOTE — Telephone Encounter (Signed)
Patient complains of abdominal bloating and nausea for about one month. Has occasional diarrhea no blood/mucous in stool and only once has he vomited.Denies SOB/CP. Reported temperature as 99.4 oral one day last week-none since. No difficulty with voiding. Stated he has drank a lot of alcohol in the past and he is gay-concerned about liver disease and HIV. He would like to have testing done for both.reviewed care advice with patient. Denies travels and any exposures to covid. Appointment scheduled for Wednesday.   Reason for Disposition . Abdominal pain is a chronic symptom (recurrent or ongoing AND present > 4 weeks)  Answer Assessment - Initial Assessment Questions 1. LOCATION: "Where does it hurt?"      All of the abdomen 2. RADIATION: "Does the pain shoot anywhere else?" (e.g., chest, back)     no 3. ONSET: "When did the pain begin?" (Minutes, hours or days ago)      About one month ago.  4. SUDDEN: "Gradual or sudden onset?"    gradual 5. PATTERN "Does the pain come and go, or is it constant?"    - If constant: "Is it getting better, staying the same, or worsening?"      (Note: Constant means the pain never goes away completely; most serious pain is constant and it progresses)     - If intermittent: "How long does it last?" "Do you have pain now?"     (Note: Intermittent means the pain goes away completely between bouts)     Comes and goes 6. SEVERITY: "How bad is the pain?"  (e.g., Scale 1-10; mild, moderate, or severe)    - MILD (1-3): doesn't interfere with normal activities, abdomen soft and not tender to touch     - MODERATE (4-7): interferes with normal activities or awakens from sleep, tender to touch     - SEVERE (8-10): excruciating pain, doubled over, unable to do any normal activities      mild 7. RECURRENT SYMPTOM: "Have you ever had this type of abdominal pain before?" If so, ask: "When was the last time?" and "What happened that time?"     no 8. CAUSE: "What do you think  is causing the abdominal pain?"     unsure 9. RELIEVING/AGGRAVATING FACTORS: "What makes it better or worse?" (e.g., movement, antacids, bowel movement)    Has not tried any otc medications 10. OTHER SYMPTOMS: "Has there been any vomiting, diarrhea, constipation, or urine problems?"       Occasional bout of diarrhea.  Protocols used: ABDOMINAL PAIN - MALE-A-AH

## 2018-09-13 ENCOUNTER — Other Ambulatory Visit: Payer: Self-pay

## 2018-09-13 ENCOUNTER — Encounter: Payer: Self-pay | Admitting: Family Medicine

## 2018-09-13 ENCOUNTER — Ambulatory Visit: Payer: 59 | Admitting: Family Medicine

## 2018-09-13 VITALS — BP 142/78 | Temp 98.1°F | Resp 16 | Ht 67.0 in | Wt 194.6 lb

## 2018-09-13 DIAGNOSIS — R140 Abdominal distension (gaseous): Secondary | ICD-10-CM | POA: Diagnosis not present

## 2018-09-13 DIAGNOSIS — Z113 Encounter for screening for infections with a predominantly sexual mode of transmission: Secondary | ICD-10-CM

## 2018-09-13 DIAGNOSIS — Z79899 Other long term (current) drug therapy: Secondary | ICD-10-CM | POA: Diagnosis not present

## 2018-09-13 LAB — COMPREHENSIVE METABOLIC PANEL
ALT: 47 U/L (ref 0–53)
AST: 32 U/L (ref 0–37)
Albumin: 4.2 g/dL (ref 3.5–5.2)
Alkaline Phosphatase: 96 U/L (ref 39–117)
BUN: 6 mg/dL (ref 6–23)
CO2: 27 mEq/L (ref 19–32)
Calcium: 9.6 mg/dL (ref 8.4–10.5)
Chloride: 102 mEq/L (ref 96–112)
Creatinine, Ser: 0.86 mg/dL (ref 0.40–1.50)
GFR: 121.86 mL/min (ref >60.00)
Glucose, Bld: 84 mg/dL (ref 70–99)
Potassium: 4.3 mEq/L (ref 3.5–5.1)
Sodium: 138 mEq/L (ref 135–145)
Total Bilirubin: 0.5 mg/dL (ref 0.2–1.2)
Total Protein: 7.2 g/dL (ref 6.0–8.3)

## 2018-09-13 NOTE — Patient Instructions (Signed)
Food Choices for Gastroesophageal Reflux Disease, Adult When you have gastroesophageal reflux disease (GERD), the foods you eat and your eating habits are very important. Choosing the right foods can help ease the discomfort of GERD. Consider working with a diet and nutrition specialist (dietitian) to help you make healthy food choices. What general guidelines should I follow?  Eating plan  Choose healthy foods low in fat, such as fruits, vegetables, whole grains, low-fat dairy products, and lean meat, fish, and poultry.  Eat frequent, small meals instead of three large meals each day. Eat your meals slowly, in a relaxed setting. Avoid bending over or lying down until 2-3 hours after eating.  Limit high-fat foods such as fatty meats or fried foods.  Limit your intake of oils, butter, and shortening to less than 8 teaspoons each day.  Avoid the following: ? Foods that cause symptoms. These may be different for different people. Keep a food diary to keep track of foods that cause symptoms. ? Alcohol. ? Drinking large amounts of liquid with meals. ? Eating meals during the 2-3 hours before bed.  Cook foods using methods other than frying. This may include baking, grilling, or broiling. Lifestyle  Maintain a healthy weight. Ask your health care provider what weight is healthy for you. If you need to lose weight, work with your health care provider to do so safely.  Exercise for at least 30 minutes on 5 or more days each week, or as told by your health care provider.  Avoid wearing clothes that fit tightly around your waist and chest.  Do not use any products that contain nicotine or tobacco, such as cigarettes and e-cigarettes. If you need help quitting, ask your health care provider.  Sleep with the head of your bed raised. Use a wedge under the mattress or blocks under the bed frame to raise the head of the bed. What foods are not recommended? The items listed may not be a complete  list. Talk with your dietitian about what dietary choices are best for you. Grains Pastries or quick breads with added fat. French toast. Vegetables Deep fried vegetables. French fries. Any vegetables prepared with added fat. Any vegetables that cause symptoms. For some people this may include tomatoes and tomato products, chili peppers, onions and garlic, and horseradish. Fruits Any fruits prepared with added fat. Any fruits that cause symptoms. For some people this may include citrus fruits, such as oranges, grapefruit, pineapple, and lemons. Meats and other protein foods High-fat meats, such as fatty beef or pork, hot dogs, ribs, ham, sausage, salami and bacon. Fried meat or protein, including fried fish and fried chicken. Nuts and nut butters. Dairy Whole milk and chocolate milk. Sour cream. Cream. Ice cream. Cream cheese. Milk shakes. Beverages Coffee and tea, with or without caffeine. Carbonated beverages. Sodas. Energy drinks. Fruit juice made with acidic fruits (such as orange or grapefruit). Tomato juice. Alcoholic drinks. Fats and oils Butter. Margarine. Shortening. Ghee. Sweets and desserts Chocolate and cocoa. Donuts. Seasoning and other foods Pepper. Peppermint and spearmint. Any condiments, herbs, or seasonings that cause symptoms. For some people, this may include curry, hot sauce, or vinegar-based salad dressings. Summary  When you have gastroesophageal reflux disease (GERD), food and lifestyle choices are very important to help ease the discomfort of GERD.  Eat frequent, small meals instead of three large meals each day. Eat your meals slowly, in a relaxed setting. Avoid bending over or lying down until 2-3 hours after eating.  Limit high-fat   foods such as fatty meat or fried foods. This information is not intended to replace advice given to you by your health care provider. Make sure you discuss any questions you have with your health care provider. Document Released:  01/04/2005 Document Revised: 04/27/2018 Document Reviewed: 01/06/2016 Elsevier Patient Education  2020 Elsevier Inc.  

## 2018-09-13 NOTE — Progress Notes (Signed)
Subjective:     Patient ID: Edwin Garcia, male   DOB: 20-Jul-1980, 38 y.o.   MRN: [id number]  HPI Patient is seen with the following issues  He states for about a month he has had some bloating and nausea.  He actually made some dietary revisions about a week ago with reducing fried and fatty foods and he states he has already lost about 7 pounds over the past week and a half.  No associated abdominal pain.  No active GERD symptoms.  No dysphagia.  No loss of appetite.  Questionable lactose intolerance.  He has reduced caffeine and alcohol intake over the past year.  No known history of gluten intolerance.  Bloating and nausea seem to be worse after fatty food intake.  Other issue is that we had discussed preexposure prophylaxis last year with Truvada.  He had some concerns because of liver cautions with that medication and he never started this.  He has had multiple sexual partners in the past.  No current active symptoms.  He has not had any STD screening in almost a year.  Denies any fever, dysuria, penile d/c, or rash.  Past Medical History:  Diagnosis Date  . Allergy   . APHTHOUS ULCERS 05/30/2009  . Bulimia    younger adult  . CHALAZION, LEFT 05/30/2009  . DEPRESSION 11/01/2008  . Panic attack   . Syphilis    Past Surgical History:  Procedure Laterality Date  . ANKLE SURGERY  1999   RIGHT  . TONSILLECTOMY     REMOVED AT 38 YRS OLD    reports that he has never smoked. He has never used smokeless tobacco. He reports current alcohol use. He reports that he does not use drugs. family history includes Cirrhosis in his paternal grandfather; Diabetes in his maternal grandmother; Diabetes (age of onset: 56) in his maternal grandfather; Healthy in his mother; Hypertension in his paternal grandmother; Hypertension (age of onset: 37) in his maternal grandmother; Hypertension (age of onset: 46) in his maternal grandfather; Other in his father. Allergies  Allergen Reactions  . Cephalexin  Nausea Only    headache     Review of Systems  Constitutional: Negative for fatigue.  HENT: Negative for trouble swallowing.   Eyes: Negative for visual disturbance.  Respiratory: Negative for cough, chest tightness and shortness of breath.   Cardiovascular: Negative for chest pain, palpitations and leg swelling.  Gastrointestinal: Negative for blood in stool, diarrhea, nausea and vomiting.  Genitourinary: Negative for discharge, genital sores and hematuria.  Neurological: Negative for dizziness, syncope, weakness, light-headedness and headaches.       Objective:   Physical Exam Constitutional:      Appearance: Normal appearance.  Cardiovascular:     Rate and Rhythm: Normal rate and regular rhythm.  Pulmonary:     Effort: Pulmonary effort is normal.     Breath sounds: Normal breath sounds.  Abdominal:     General: There is no distension.     Palpations: Abdomen is soft. There is no mass.     Tenderness: There is no abdominal tenderness. There is no guarding or rebound.     Hernia: No hernia is present.  Musculoskeletal:     Right lower leg: No edema.     Left lower leg: No edema.  Neurological:     Mental Status: He is alert.        Assessment:     #1 intermittent abdominal bloating and nausea without any associated vomiting or abdominal pain.  Etiology not clear.  Question GERD related.  Patient has already seen some improvement with restricting fatty food intake. Also discussed some of this could be flatulence/gas   #2 high risk for STD    Plan:     -Discussed GERD risk factors from a dietary standpoint.  He has already made some positive changes.  Handout given.  Follow-up promptly for any abdominal pain or new symptoms -He is encouraged to continue with his weight loss efforts -Check labs with HIV, RPR, GC, chlamydia, hepatitis B surface antigen screening Also check comprehensive metabolic panel -If the above normal consider starting Descovy daily for  preexposure prophylaxis. -Recommend 4473-month follow-up to reevaluate  Kristian CoveyBruce W Sonakshi Rolland MD [facility]

## 2018-09-14 ENCOUNTER — Telehealth: Payer: Self-pay | Admitting: *Deleted

## 2018-09-14 LAB — C. TRACHOMATIS/N. GONORRHOEAE RNA
C. trachomatis RNA, TMA: NOT DETECTED
N. gonorrhoeae RNA, TMA: NOT DETECTED

## 2018-09-14 LAB — RPR: RPR Ser Ql: NONREACTIVE

## 2018-09-14 LAB — HEPATITIS B SURFACE ANTIGEN: Hepatitis B Surface Ag: NONREACTIVE

## 2018-09-14 LAB — HIV ANTIBODY (ROUTINE TESTING W REFLEX): HIV 1&2 Ab, 4th Generation: NONREACTIVE

## 2018-09-14 NOTE — Telephone Encounter (Signed)
Pt returned call for his lab results. Lab message given to him with verbal understanding. He is is agreement to start Descovy and a 3 month follow up appointment is scheduled.

## 2018-12-18 ENCOUNTER — Ambulatory Visit: Payer: 59 | Admitting: Family Medicine

## 2019-02-12 ENCOUNTER — Encounter: Payer: Self-pay | Admitting: Family Medicine

## 2019-02-12 ENCOUNTER — Other Ambulatory Visit: Payer: Self-pay

## 2019-02-12 ENCOUNTER — Ambulatory Visit (INDEPENDENT_AMBULATORY_CARE_PROVIDER_SITE_OTHER): Payer: Self-pay | Admitting: Family Medicine

## 2019-02-12 VITALS — BP 126/72 | HR 87 | Temp 97.7°F | Ht 67.0 in | Wt 200.1 lb

## 2019-02-12 DIAGNOSIS — Z113 Encounter for screening for infections with a predominantly sexual mode of transmission: Secondary | ICD-10-CM

## 2019-02-12 NOTE — Progress Notes (Signed)
  Subjective:     Patient ID: Edwin Garcia, male   DOB: April 30, 1980, 39 y.o.   MRN: [id number]  HPI [name] is seen requesting STD screening.  Recent new partner.  He states his partner had tested positive for syphilis recently.  He currently has no symptoms whatsoever.  No penile discharge.  No skin rashes.  No fevers or chills.  His last STD screening was back in August.  He also has been prescribed Descovy through a clinic elsewhere and they have been monitoring his kidney and liver functions as well as periodic STD screening.  He states that he does not do any oral or anal receptive sex.    Past Medical History:  Diagnosis Date  . Allergy   . APHTHOUS ULCERS 05/30/2009  . Bulimia    younger adult  . CHALAZION, LEFT 05/30/2009  . DEPRESSION 11/01/2008  . Panic attack   . Syphilis    Past Surgical History:  Procedure Laterality Date  . ANKLE SURGERY  1999   RIGHT  . TONSILLECTOMY     REMOVED AT 39 YRS OLD    reports that he has never smoked. He has never used smokeless tobacco. He reports current alcohol use. He reports that he does not use drugs. family history includes Cirrhosis in his paternal grandfather; Diabetes in his maternal grandmother; Diabetes (age of onset: 47) in his maternal grandfather; Healthy in his mother; Hypertension in his paternal grandmother; Hypertension (age of onset: 70) in his maternal grandmother; Hypertension (age of onset: 69) in his maternal grandfather; Other in his father. Allergies  Allergen Reactions  . Cephalexin Nausea Only    headache     Review of Systems  Constitutional: Negative for chills and fever.  Gastrointestinal: Negative for abdominal pain.  Genitourinary: Negative for discharge, dysuria and penile pain.  Skin: Negative for rash.       Objective:   Physical Exam Vitals reviewed.  Constitutional:      Appearance: Normal appearance.  Cardiovascular:     Rate and Rhythm: Normal rate and regular rhythm.  Pulmonary:   Effort: Pulmonary effort is normal.     Breath sounds: Normal breath sounds.  Skin:    Findings: No rash.  Neurological:     Mental Status: He is alert.        Assessment:     STD screening.  High risk sexual behavior.  Currently asymptomatic.  Possible recent exposure to someone with syphilis.  Denies oral or anal sex.      Plan:     -Check HIV, RPR, urine for GC and chlamydia.  We discussed oral and annual screening as well for GC and committee but he declines based on issues above.  We do not have lab personnel here here today.  He had orders placed for future labs for this Thursday  -He has previously gotten Descovy through another clinic would like [place] to have take over prescribing if possible.  He thinks his last lab work was about a month and a half ago.  He is not due at this time  Kristian Covey MD [facility]

## 2019-02-13 LAB — GC/CHLAMYDIA PROBE AMP
Chlamydia trachomatis, NAA: NEGATIVE
Neisseria Gonorrhoeae by PCR: NEGATIVE

## 2019-02-13 LAB — HIV ANTIBODY (ROUTINE TESTING W REFLEX): HIV 1&2 Ab, 4th Generation: NONREACTIVE

## 2019-02-13 LAB — RPR: RPR Ser Ql: NONREACTIVE

## 2019-05-07 ENCOUNTER — Ambulatory Visit: Payer: Self-pay

## 2019-05-25 ENCOUNTER — Ambulatory Visit: Payer: Self-pay | Attending: Internal Medicine

## 2019-05-25 DIAGNOSIS — Z23 Encounter for immunization: Secondary | ICD-10-CM

## 2019-05-25 NOTE — Progress Notes (Signed)
   Covid-19 Vaccination Clinic  Name:  Edwin Garcia    MRN: [id number] DOB: [birth year]  05/25/2019  [name] was observed post Covid-19 immunization for 15 minutes without incident. He was provided with Vaccine Information Sheet and instruction to access the V-Safe system.   [name] was instructed to call 911 with any severe reactions post vaccine: Marland Kitchen Difficulty breathing  . Swelling of face and throat  . A fast heartbeat  . A bad rash all over body  . Dizziness and weakness   Immunizations Administered    Name Date Dose VIS Date Route   Pfizer COVID-19 Vaccine 05/25/2019 11:02 AM 0.3 mL 03/14/2018 Intramuscular   Manufacturer: [employer], [employer]   Lot: [id number]   NDC: [address]-1

## 2019-06-16 ENCOUNTER — Ambulatory Visit: Payer: Self-pay

## 2019-06-16 ENCOUNTER — Ambulatory Visit: Payer: Self-pay | Attending: Internal Medicine

## 2019-06-16 DIAGNOSIS — Z23 Encounter for immunization: Secondary | ICD-10-CM

## 2019-06-16 NOTE — Progress Notes (Signed)
   Covid-19 Vaccination Clinic  Name:  Edwin Garcia    MRN: [id number] DOB: 27-Jan-1980  06/16/2019  [name] was observed post Covid-19 immunization for 15 minutes without incident. He was provided with Vaccine Information Sheet and instruction to access the V-Safe system.   [name] was instructed to call 911 with any severe reactions post vaccine: Marland Kitchen Difficulty breathing  . Swelling of face and throat  . A fast heartbeat  . A bad rash all over body  . Dizziness and weakness   Immunizations Administered    Name Date Dose VIS Date Route   Pfizer COVID-19 Vaccine 06/16/2019 11:43 AM 0.3 mL 03/14/2018 Intramuscular   Manufacturer: [employer], [employer]   Lot: [id number]   NDC: [address]-8

## 2019-11-05 ENCOUNTER — Other Ambulatory Visit: Payer: Self-pay

## 2019-11-05 DIAGNOSIS — Z20822 Contact with and (suspected) exposure to covid-19: Secondary | ICD-10-CM

## 2019-11-06 LAB — SARS-COV-2, NAA 2 DAY TAT

## 2019-11-06 LAB — NOVEL CORONAVIRUS, NAA: SARS-CoV-2, NAA: NOT DETECTED

## 2020-01-30 ENCOUNTER — Telehealth (INDEPENDENT_AMBULATORY_CARE_PROVIDER_SITE_OTHER): Payer: HRSA Program | Admitting: Family Medicine

## 2020-01-30 ENCOUNTER — Encounter: Payer: Self-pay | Admitting: Family Medicine

## 2020-01-30 VITALS — Ht 67.0 in | Wt 200.0 lb

## 2020-01-30 DIAGNOSIS — A6000 Herpesviral infection of urogenital system, unspecified: Secondary | ICD-10-CM | POA: Diagnosis not present

## 2020-01-30 DIAGNOSIS — U071 COVID-19: Secondary | ICD-10-CM

## 2020-01-30 MED ORDER — VALACYCLOVIR HCL 500 MG PO TABS: ORAL_TABLET | ORAL | 2 refills | Status: AC

## 2020-01-30 NOTE — Progress Notes (Signed)
Patient ID: Edwin Garcia, male   DOB: 05/17/1980, 40 y.o.   MRN: [id number]  This visit type was conducted due to national recommendations for restrictions regarding the COVID-19 pandemic in an effort to limit this patient's exposure and mitigate transmission in our community.   Virtual Visit via Telephone Note  I connected with Edwin Garcia on 01/30/20 at  1:30 PM EST by telephone and verified that I am speaking with the correct person using two identifiers.   I discussed the limitations, risks, security and privacy concerns of performing an evaluation and management service by telephone and the availability of in person appointments. I also discussed with the patient that there may be a patient responsible charge related to this service. The patient expressed understanding and agreed to proceed.  Location patient: home Location provider: work or home office Participants present for the call: patient, provider Patient did not have a visit in the prior 7 days to address this/these issue(s).   History of Present Illness:  [name] called basically requesting refill of Valtrex.  He has history of genital herpes.  Usually has flareups about twice per year.  He has been taking Valtrex 500 mg twice daily for 3 days for flareups.  He is not interested in daily prophylaxis at this time.  He also recently stopped Descovy.  He currently has 1 partner  Had recent respiratory infection and tested positive for COVID.  He did not get positive test back until 1 week ago today.  His onset of symptoms was around December 27.  He had chills, body aches, low-grade fever, chest pain, cough.  He is back at work now and improved.  Past Medical History:  Diagnosis Date  . Allergy   . APHTHOUS ULCERS 05/30/2009  . Bulimia    younger adult  . CHALAZION, LEFT 05/30/2009  . DEPRESSION 11/01/2008  . Panic attack   . Syphilis    Past Surgical History:  Procedure Laterality Date  . ANKLE SURGERY  1999    RIGHT  . TONSILLECTOMY     REMOVED AT 40 YRS OLD    reports that he has never smoked. He has never used smokeless tobacco. He reports current alcohol use. He reports that he does not use drugs. family history includes Cirrhosis in his paternal grandfather; Diabetes in his maternal grandmother; Diabetes (age of onset: 82) in his maternal grandfather; Healthy in his mother; Hypertension in his paternal grandmother; Hypertension (age of onset: 27) in his maternal grandmother; Hypertension (age of onset: 16) in his maternal grandfather; Other in his father. Allergies  Allergen Reactions  . Cephalexin Nausea Only    headache      Observations/Objective: Patient sounds cheerful and well on the phone. I do not appreciate any SOB. Speech and thought processing are grossly intact. Patient reported vitals:  Assessment and Plan:  #1 recent COVID infection clinically improved  #2 history of genital herpes-stable with infrequent flare-ups -Refill Valtrex 500 mg 1 tablet by mouth twice daily for 3 days as needed -We encouraged him to consider getting back on Descovy especially if multiple partners or new future partners  Follow Up Instructions:  -As above   99441 5-10 99442 11-20 99443 21-30 I did not refer this patient for an OV in the next 24 hours for this/these issue(s).  I discussed the assessment and treatment plan with the patient. The patient was provided an opportunity to ask questions and all were answered. The patient agreed with the plan and demonstrated an understanding  of the instructions.   The patient was advised to call back or seek an in-person evaluation if the symptoms worsen or if the condition fails to improve as anticipated.  I provided 12 minutes of non-face-to-face time during this encounter.   Evelena Peat, MD

## 2020-05-21 ENCOUNTER — Emergency Department (HOSPITAL_COMMUNITY)
Admission: EM | Admit: 2020-05-21 | Discharge: 2020-05-21 | Disposition: A | Discharge status: Home / Self Care | Payer: Self-pay | Attending: Emergency Medicine | Admitting: Emergency Medicine

## 2020-05-21 DIAGNOSIS — M545 Low back pain, unspecified: Secondary | ICD-10-CM | POA: Insufficient documentation

## 2020-05-21 DIAGNOSIS — X500XXA Overexertion from strenuous movement or load, initial encounter: Secondary | ICD-10-CM | POA: Insufficient documentation

## 2020-05-21 MED ORDER — IBUPROFEN 600 MG PO TABS: 600.0000 mg | ORAL_TABLET | Freq: Four times a day (QID) | ORAL | 0 refills | Status: DC | PRN

## 2020-05-21 MED ORDER — ACETAMINOPHEN 500 MG PO TABS
1000.0000 mg | ORAL_TABLET | Freq: Once | ORAL | Status: AC
  Administered 2020-05-21: 1000 mg via ORAL
  Filled 2020-05-21: qty 2

## 2020-05-21 MED ORDER — LIDOCAINE 5 % EX PTCH
2.0000 | MEDICATED_PATCH | CUTANEOUS | Status: DC
  Administered 2020-05-21: 2 via TRANSDERMAL
  Filled 2020-05-21: qty 2

## 2020-05-21 MED ORDER — LIDOCAINE 5 % EX PTCH: 1.0000 | MEDICATED_PATCH | CUTANEOUS | 0 refills | Status: AC

## 2020-05-21 MED ORDER — METHOCARBAMOL 750 MG PO TABS: 750.0000 mg | ORAL_TABLET | Freq: Two times a day (BID) | ORAL | 0 refills | Status: DC | PRN

## 2020-05-21 MED ORDER — HYDROCODONE-ACETAMINOPHEN 5-325 MG PO TABS: 1.0000 | ORAL_TABLET | Freq: Four times a day (QID) | ORAL | 0 refills | Status: DC | PRN

## 2020-05-21 NOTE — ED Triage Notes (Signed)
Pt arrived pov due to back pain. Pt reports last Thursday he was working at the [employer] when  he strained his back. Pt reports it is a sharp pain that takes his breath away. Pt reports pain is a 8/10. Pt denies any other complaints.

## 2020-05-21 NOTE — Discharge Instructions (Addendum)
  Expect your soreness to increase over the next 2-3 days. Take it easy, but do not lay around too much as this may make any stiffness worse.  Antiinflammatory medications: Take 600 mg of ibuprofen every 6 hours or 440 mg (over the counter dose) to 500 mg (prescription dose) of naproxen every 12 hours for the next 3 days. After this time, these medications may be used as needed for pain. Take these medications with food to avoid upset stomach. Choose only one of these medications, do not take them together. Acetaminophen (generic for Tylenol): Should you continue to have additional pain while taking the ibuprofen or naproxen, you may add in acetaminophen as needed. Your daily total maximum amount of acetaminophen from all sources should be limited to 4000mg/day for persons without liver problems, or 2000mg/day for those with liver problems. Vicodin: May take Vicodin (hydrocodone-acetaminophen) as needed for severe pain.   Do not drive or perform other dangerous activities while taking this medication as it can cause drowsiness as well as changes in reaction time and judgement.   Please note that each pill of Vicodin contains 325 mg of acetaminophen (generic for Tylenol) and the above dosage limits apply. Methocarbamol: Methocarbamol (generic for Robaxin) is a muscle relaxer and can help relieve stiff muscles or muscle spasms.  Do not drive or perform other dangerous activities while taking this medication as it can cause drowsiness as well as changes in reaction time and judgement. Lidocaine patches: These are available via either prescription or over-the-counter. The over-the-counter option may be more economical one and are likely just as effective. There are multiple over-the-counter brands, such as Salonpas. Ice: May apply ice to the area over the next 24 hours for 15 minutes at a time to reduce pain, inflammation, and swelling, if present. Exercises: Be sure to perform the attached exercises starting  with three times a week and working up to performing them daily. This is an essential part of preventing long term problems.  Follow up: Follow up with a primary care provider for any future management of these complaints. Be sure to follow up within 7-10 days. Return: Return to the ED should symptoms worsen.  For prescription assistance, may try using prescription discount sites or apps, such as goodrx.com 

## 2020-05-21 NOTE — ED Provider Notes (Signed)
[facility] EMERGENCY DEPARTMENT Provider Note   CSN: [id number] Arrival date & time: 05/21/20  2585     History Chief Complaint  Patient presents with  . Back Pain    MAJOUR FREI is a 40 y.o. male.  HPI      HENDRY SPEAS is a 40 y.o. male,  presenting to the ED with back pain for the last week. Patient states he began to have right lower back pain after lifting a heavy suitcase.  Pain is in the lower right back, tightness, soreness, moderate to severe, nonradiating. He has taken [facility] powders for his pain with last dose this morning around 5 AM.  Denies fever/chills, abdominal pain, groin pain, changes in bowel or bladder function, saddle anesthesias, numbness, weakness, direct trauma, falls, or any other complaints.    Past Medical History:  Diagnosis Date  . Allergy   . APHTHOUS ULCERS 05/30/2009  . Bulimia    younger adult  . CHALAZION, LEFT 05/30/2009  . DEPRESSION 11/01/2008  . Panic attack   . Syphilis     Patient Active Problem List   Diagnosis Date Noted  . LBBB (left bundle branch block) 11/19/2015  . Exposure to HIV 11/19/2015  . Obesity, Class I, BMI 30-34.9 11/19/2015  . Genital herpes 11/02/2011  . History of syphilis 08/18/2011  . Depression 11/01/2008    Past Surgical History:  Procedure Laterality Date  . ANKLE SURGERY  1999   RIGHT  . TONSILLECTOMY     REMOVED AT 40 YRS OLD       Family History  Problem Relation Age of Onset  . Healthy Mother   . Other Father        smoker- lung nodule  . Hypertension Maternal Grandmother 35  . Diabetes Maternal Grandmother   . Diabetes Maternal Grandfather 70       TYPE II  . Hypertension Maternal Grandfather 45  . Hypertension Paternal Grandmother   . Cirrhosis Paternal Grandfather        cirrhosis alcohol related, also smoker- ? lung cancer  . Alcohol abuse Neg Hx        family  . Arthritis Neg Hx        family  . Cancer Neg Hx        family hx - breast  ,lung,colon,prostate ca  . Depression Neg Hx        family    Social History   Tobacco Use  . Smoking status: Never Smoker  . Smokeless tobacco: Never Used  Vaping Use  . Vaping Use: Former  Substance Use Topics  . Alcohol use: Yes    Comment: 7  . Drug use: No    Home Medications Prior to Admission medications   Medication Sig Start Date End Date Taking? Authorizing Provider  HYDROcodone-acetaminophen (NORCO/VICODIN) 5-325 MG tablet Take 1 tablet by mouth every 6 (six) hours as needed for severe pain. 05/21/20  Yes Marzelle Rutten C, PA-C  ibuprofen (ADVIL) 600 MG tablet Take 1 tablet (600 mg total) by mouth every 6 (six) hours as needed. 05/21/20  Yes Brynlynn Walko C, PA-C  lidocaine (LIDODERM) 5 % Place 1 patch onto the skin daily. Remove & Discard patch within 12 hours or as directed by MD 05/21/20  Yes Phillip Sandler C, PA-C  methocarbamol (ROBAXIN) 750 MG tablet Take 1 tablet (750 mg total) by mouth 2 (two) times daily as needed for muscle spasms (or muscle tightness). 05/21/20  Yes Cherysh Epperly C,  PA-C  doxycycline (VIBRAMYCIN) 100 MG capsule Take 1 capsule (100 mg total) by mouth 2 (two) times daily. Patient not taking: No sig reported 10/24/17   Kristian Covey, MD  emtricitabine-tenofovir AF (DESCOVY) 200-25 MG tablet Take 1 tablet by mouth daily. Patient not taking: Reported on 01/30/2020    [provider]  ketoconazole (NIZORAL) 2 % shampoo Apply 1 application topically 2 (two) times a week. Use for 3 weeks.  Leave shampoo on for 3-5 minutes before rinsing. 06/25/15   Santiago Glad, PA-C  valACYclovir (VALTREX) 500 MG tablet Take one tablet BID for 3 days prn outbreak. 01/30/20   Burchette, Elberta Fortis, MD    Allergies    Cephalexin  Review of Systems   Review of Systems  Constitutional: Negative for chills and fever.  Gastrointestinal: Negative for abdominal pain, nausea and vomiting.  Genitourinary: Negative for difficulty urinating, dysuria, flank pain, frequency and  hematuria.  Musculoskeletal: Positive for back pain.  Neurological: Negative for syncope, weakness and numbness.    Physical Exam Updated Vital Signs BP 134/81 (BP Location: Left Arm)   Pulse 78   Temp 97.8 F (36.6 C) (Oral)   Resp 17   Ht 5\' 6"  (1.676 m)   Wt 90.7 kg   SpO2 99%   BMI 32.28 kg/m   Physical Exam Vitals and nursing note reviewed.  Constitutional:      General: He is not in acute distress.    Appearance: He is well-developed. He is not diaphoretic.  HENT:     Head: Normocephalic and atraumatic.  Eyes:     Conjunctiva/sclera: Conjunctivae normal.  Cardiovascular:     Rate and Rhythm: Normal rate and regular rhythm.     Pulses: Normal pulses.          Posterior tibial pulses are 2+ on the right side and 2+ on the left side.  Pulmonary:     Effort: Pulmonary effort is normal.  Abdominal:     Palpations: Abdomen is soft.     Tenderness: There is no abdominal tenderness.  Musculoskeletal:     Cervical back: Neck supple.     Lumbar back: Tenderness present.       Back:  Skin:    General: Skin is warm and dry.     Coloration: Skin is not pale.  Neurological:     Mental Status: He is alert.     Comments: Sensation grossly intact to light touch in the lower extremities bilaterally. No saddle anesthesias. Strength 5/5 in the bilateral lower extremities. No noted gait deficit. Coordination intact.  Psychiatric:        Behavior: Behavior normal.     ED Results / Procedures / Treatments   Labs (all labs ordered are listed, but only abnormal results are displayed) Labs Reviewed - No data to display  EKG None  Radiology No results found.  Procedures Procedures   Medications Ordered in ED Medications  lidocaine (LIDODERM) 5 % 2 patch (has no administration in time range)    ED Course  I have reviewed the triage vital signs and the nursing notes.  Pertinent labs & imaging results that were available during my care of the patient were reviewed  by me and considered in my medical decision making (see chart for details).    MDM Rules/Calculators/A&P                          Patient presents with right lower back pain  following lifting.  No evidence of neurovascular compromise. The patient was given instructions for home care as well as return precautions. Patient voices understanding of these instructions, accepts the plan, and is comfortable with discharge.    Final Clinical Impression(s) / ED Diagnoses Final diagnoses:  Acute right-sided low back pain without sciatica    Rx / DC Orders ED Discharge Orders         Ordered    ibuprofen (ADVIL) 600 MG tablet  Every 6 hours PRN        05/21/20 0748    methocarbamol (ROBAXIN) 750 MG tablet  2 times daily PRN        05/21/20 0748    lidocaine (LIDODERM) 5 %  Every 24 hours        05/21/20 0748    HYDROcodone-acetaminophen (NORCO/VICODIN) 5-325 MG tablet  Every 6 hours PRN        05/21/20 0750           Anselm Pancoast, PA-C 05/21/20 0755    Cheryll Cockayne, MD 05/21/20 [phone]

## 2020-10-06 ENCOUNTER — Other Ambulatory Visit: Payer: Self-pay

## 2020-10-07 ENCOUNTER — Other Ambulatory Visit (HOSPITAL_COMMUNITY)
Admission: RE | Admit: 2020-10-07 | Discharge: 2020-10-07 | Disposition: A | Discharge status: Home / Self Care | Payer: 59 | Source: Ambulatory Visit | Attending: Family Medicine | Admitting: Family Medicine

## 2020-10-07 ENCOUNTER — Ambulatory Visit (INDEPENDENT_AMBULATORY_CARE_PROVIDER_SITE_OTHER): Payer: 59 | Admitting: Family Medicine

## 2020-10-07 ENCOUNTER — Encounter: Payer: Self-pay | Admitting: Family Medicine

## 2020-10-07 VITALS — BP 138/90 | HR 83 | Temp 97.7°F | Ht 65.5 in | Wt 214.2 lb

## 2020-10-07 DIAGNOSIS — Z113 Encounter for screening for infections with a predominantly sexual mode of transmission: Secondary | ICD-10-CM

## 2020-10-07 DIAGNOSIS — Z0000 Encounter for general adult medical examination without abnormal findings: Secondary | ICD-10-CM

## 2020-10-07 DIAGNOSIS — Z23 Encounter for immunization: Secondary | ICD-10-CM | POA: Diagnosis not present

## 2020-10-07 LAB — BASIC METABOLIC PANEL
BUN: 10 mg/dL (ref 6–23)
CO2: 28 mEq/L (ref 19–32)
Calcium: 9.8 mg/dL (ref 8.4–10.5)
Chloride: 102 mEq/L (ref 96–112)
Creatinine, Ser: 0.97 mg/dL (ref 0.40–1.50)
GFR: 99.45 mL/min (ref >60.00)
Glucose, Bld: 85 mg/dL (ref 70–99)
Potassium: 4.3 mEq/L (ref 3.5–5.1)
Sodium: 139 mEq/L (ref 135–145)

## 2020-10-07 LAB — LIPID PANEL
Cholesterol: 222 mg/dL — ABNORMAL HIGH (ref 0–200)
HDL: 57.2 mg/dL (ref >39.00)
LDL Cholesterol: 144 mg/dL — ABNORMAL HIGH (ref 0–99)
NonHDL: 165.14
Total CHOL/HDL Ratio: 4
Triglycerides: 105 mg/dL (ref 0.0–149.0)
VLDL: 21 mg/dL (ref 0.0–40.0)

## 2020-10-07 LAB — CBC WITH DIFFERENTIAL/PLATELET
Basophils Absolute: 0 10*3/uL (ref 0.0–0.1)
Basophils Relative: 0.5 % (ref 0.0–3.0)
Eosinophils Absolute: 0.1 10*3/uL (ref 0.0–0.7)
Eosinophils Relative: 1.2 % (ref 0.0–5.0)
HCT: 43.4 % (ref 39.0–52.0)
Hemoglobin: 14.6 g/dL (ref 13.0–17.0)
Lymphocytes Relative: 30.4 % (ref 12.0–46.0)
Lymphs Abs: 1.9 10*3/uL (ref 0.7–4.0)
MCHC: 33.7 g/dL (ref 30.0–36.0)
MCV: 86.5 fl (ref 78.0–100.0)
Monocytes Absolute: 0.7 10*3/uL (ref 0.1–1.0)
Monocytes Relative: 11.4 % (ref 3.0–12.0)
Neutro Abs: 3.5 10*3/uL (ref 1.4–7.7)
Neutrophils Relative %: 56.5 % (ref 43.0–77.0)
Platelets: 316 10*3/uL (ref 150.0–400.0)
RBC: 5.01 Mil/uL (ref 4.22–5.81)
RDW: 14.6 % (ref 11.5–15.5)
WBC: 6.2 10*3/uL (ref 4.0–10.5)

## 2020-10-07 LAB — HEPATIC FUNCTION PANEL
ALT: 20 U/L (ref 0–53)
AST: 17 U/L (ref 0–37)
Albumin: 4.3 g/dL (ref 3.5–5.2)
Alkaline Phosphatase: 102 U/L (ref 39–117)
Bilirubin, Direct: 0.1 mg/dL (ref 0.0–0.3)
Total Bilirubin: 0.6 mg/dL (ref 0.2–1.2)
Total Protein: 7.8 g/dL (ref 6.0–8.3)

## 2020-10-07 LAB — HEMOGLOBIN A1C: Hgb A1c MFr Bld: 5.5 % (ref 4.6–6.5)

## 2020-10-07 LAB — TSH: TSH: 1.9 u[IU]/mL (ref 0.35–5.50)

## 2020-10-07 NOTE — Progress Notes (Signed)
Established Patient Office Visit  Subjective:  Patient ID: Edwin Garcia, male    DOB: Mar 29, 1980  Age: 40 y.o. MRN: [id number]  CC:  Chief Complaint  Patient presents with   Annual Exam    HPI Edwin Garcia presents for complete physical.  Generally fairly healthy.  Has had syphilis in the past which was treated.  He currently has 1 partner and plans to propose soon.  He denies any symptoms of urinary discharge or any burning with urination or any new rashes.  He is concerned about screening for diabetes.  He had some recent weight gain.  Binge alcohol drinking at times.  He did have some counseling during the past couple weeks and feels that has been beneficial. He previously took preexposure prophylaxis medication but has had only 1 partner for the past year and does not wish to continue on that.  Health maintenance reviewed.  -Needs flu vaccine -Tetanus up-to-date  Family history significant for diabetes and hypertension maternal grandmother.  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  Non-smoker.  Alcohol use as above.    Past Medical History:  Diagnosis Date   Allergy    APHTHOUS ULCERS 05/30/2009   Bulimia    younger adult   CHALAZION, LEFT 05/30/2009   DEPRESSION 11/01/2008   Panic attack    Syphilis     Past Surgical History:  Procedure Laterality Date   ANKLE SURGERY  1999   RIGHT   TONSILLECTOMY     REMOVED AT 40 YRS OLD    Family History  Problem Relation Age of Onset   Hyperlipidemia Mother    Healthy Mother    Other Father        smoker- lung nodule   Hypertension Maternal Grandmother 83   Diabetes Maternal Grandmother    Diabetes Maternal Grandfather 27       TYPE II   Hypertension Maternal Grandfather 45   Hypertension Paternal Grandmother    Cirrhosis Paternal Grandfather        cirrhosis alcohol related, also smoker- ? lung cancer   Alcohol abuse Neg Hx        family   Arthritis Neg Hx        family    Cancer Neg Hx        family hx - breast ,lung,colon,prostate ca   Depression Neg Hx        family    Social History   Socioeconomic History   Marital status: Single    Spouse name: Not on file   Number of children: Not on file   Years of education: Not on file   Highest education level: Not on file  Occupational History   Occupation: SECURITY    Employer: [employer]  Tobacco Use   Smoking status: Never   Smokeless tobacco: Never  Vaping Use   Vaping Use: Former  Substance and Sexual Activity   Alcohol use: Yes    Comment: 7   Drug use: No   Sexual activity: Yes    Birth control/protection: Condom    Comment: NUMBER OF SEX PARTNERS IN 12 MOS - 6, Homosexual male  Other Topics Concern   Not on file  Social History Narrative   Lives alone. Monogomous partner for 1 year year in 11/2015. Partner HIV positive. HD testing 2-3x a year.       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    Social Determinants of Health   Financial Resource Strain: Not on file  Food Insecurity: Not on file  Transportation Needs: Not on file  Physical Activity: Not on file  Stress: Not on file  Social Connections: Not on file  Intimate Partner Violence: Not on file    Outpatient Medications Prior to Visit  Medication Sig Dispense Refill   ibuprofen (ADVIL) 600 MG tablet Take 1 tablet (600 mg total) by mouth every 6 (six) hours as needed. 30 tablet 0   ketoconazole (NIZORAL) 2 % shampoo Apply 1 application topically 2 (two) times a week. Use for 3 weeks.  Leave shampoo on for 3-5 minutes before rinsing. 120 mL 0   valACYclovir (VALTREX) 500 MG tablet Take one tablet BID for 3 days prn outbreak. 30 tablet 2   HYDROcodone-acetaminophen (NORCO/VICODIN) 5-325 MG tablet Take 1 tablet by mouth every 6 (six) hours as needed for severe pain. 8 tablet 0   lidocaine (LIDODERM) 5 % Place 1 patch onto the skin daily.  Remove & Discard patch within 12 hours or as directed by MD (Patient not taking: Reported on 10/07/2020) 30 patch 0   doxycycline (VIBRAMYCIN) 100 MG capsule Take 1 capsule (100 mg total) by mouth 2 (two) times daily. (Patient not taking: Reported on 10/07/2020) 20 capsule 1   emtricitabine-tenofovir AF (DESCOVY) 200-25 MG tablet Take 1 tablet by mouth daily. (Patient not taking: No sig reported)     methocarbamol (ROBAXIN) 750 MG tablet Take 1 tablet (750 mg total) by mouth 2 (two) times daily as needed for muscle spasms (or muscle tightness). (Patient not taking: Reported on 10/07/2020) 20 tablet 0   No facility-administered medications prior to visit.    Allergies  Allergen Reactions   Cephalexin Nausea Only    headache    ROS Review of Systems  Constitutional:  Negative for activity change, appetite change, fatigue and fever.  HENT:  Negative for congestion, ear pain and trouble swallowing.   Eyes:  Negative for pain and visual disturbance.  Respiratory:  Negative for cough, shortness of breath and wheezing.   Cardiovascular:  Negative for chest pain and palpitations.  Gastrointestinal:  Negative for abdominal distention, abdominal pain, blood in stool, constipation, diarrhea, nausea, rectal pain and vomiting.  Endocrine: Negative for polydipsia and polyuria.  Genitourinary:  Negative for dysuria, hematuria and testicular pain.  Musculoskeletal:  Negative for arthralgias and joint swelling.  Skin:  Negative for rash.  Neurological:  Negative for dizziness, syncope and headaches.  Hematological:  Negative for adenopathy.  Psychiatric/Behavioral:  Negative for confusion and dysphoric mood.      Objective:    Physical Exam Constitutional:      General: He is not in acute distress.    Appearance: He is well-developed.  HENT:     Head: Normocephalic and atraumatic.     Right Ear: External ear normal.     Left Ear: External ear normal.  Eyes:     Conjunctiva/sclera: Conjunctivae  normal.     Pupils: Pupils are equal, round, and reactive to light.  Neck:     Thyroid: No thyromegaly.  Cardiovascular:     Rate and Rhythm: Normal rate and regular rhythm.     Heart sounds: Normal heart sounds. No murmur heard. Pulmonary:     Effort: No respiratory distress.     Breath sounds: No wheezing or rales.  Abdominal:     General: Bowel sounds are normal. There is no distension.  Palpations: Abdomen is soft. There is no mass.     Tenderness: There is no abdominal tenderness. There is no guarding or rebound.  Musculoskeletal:     Cervical back: Normal range of motion and neck supple.  Lymphadenopathy:     Cervical: No cervical adenopathy.  Skin:    Findings: No rash.  Neurological:     Mental Status: He is alert and oriented to person, place, and time.     Cranial Nerves: No cranial nerve deficit.     Deep Tendon Reflexes: Reflexes normal.    BP 138/90 (BP Location: Right Arm, Patient Position: Sitting, Cuff Size: Normal)   Pulse 83   Temp 97.7 F (36.5 C) (Oral)   Ht 5' 5.5" (1.664 m)   Wt 214 lb 3.2 oz (97.2 kg)   SpO2 97%   BMI 35.10 kg/m  Wt Readings from Last 3 Encounters:  10/07/20 214 lb 3.2 oz (97.2 kg)  05/21/20 200 lb (90.7 kg)  01/30/20 200 lb (90.7 kg)     Health Maintenance Due  Topic Date Due   COVID-19 Vaccine (3 - Pfizer risk series) 07/14/2019   INFLUENZA VACCINE  08/18/2020    There are no preventive care reminders to display for this patient.  Lab Results  Component Value Date   TSH 1.19 02/03/2016   Lab Results  Component Value Date   WBC 5.4 02/03/2016   HGB 14.6 02/03/2016   HCT 42.8 02/03/2016   MCV 89.3 02/03/2016   PLT 274.0 02/03/2016   Lab Results  Component Value Date   NA 138 09/13/2018   K 4.3 09/13/2018   CO2 27 09/13/2018   GLUCOSE 84 09/13/2018   BUN 6 09/13/2018   CREATININE 0.86 09/13/2018   BILITOT 0.5 09/13/2018   ALKPHOS 96 09/13/2018   AST 32 09/13/2018   ALT 47 09/13/2018   PROT 7.2  09/13/2018   ALBUMIN 4.2 09/13/2018   CALCIUM 9.6 09/13/2018   ANIONGAP 9 09/09/2015   GFR 121.86 09/13/2018   Lab Results  Component Value Date   CHOL 196 02/03/2016   Lab Results  Component Value Date   HDL 60.00 02/03/2016   Lab Results  Component Value Date   LDLCALC 121 (H) 02/03/2016   Lab Results  Component Value Date   TRIG 74.0 02/03/2016   Lab Results  Component Value Date   CHOLHDL 3 02/03/2016   No results found for: HGBA1C    Assessment & Plan:   Problem List Items Addressed This Visit   None Visit Diagnoses     Physical exam    -  Primary   Relevant Orders   Basic metabolic panel   Lipid panel   CBC with Differential/Platelet   TSH   Hepatic function panel   Hemoglobin A1c   Screen for STD (sexually transmitted disease)       Relevant Orders   HIV antibody (with reflex)   RPR   Urine cytology ancillary only     Blood pressure mildly elevated today.  We strongly advise a scale back alcohol and we talked about this at some length.  Also strongly vies weight loss.  Handout on DASH diet given.  Reassess blood pressure in 1 month.  If still above goal at that time consider initiating medication.  Obtain screening labs as above.  Include A1c with family history of type 2 diabetes  Flu vaccine given  No orders of the defined types were placed in this encounter.   Follow-up: Return in  about 1 month (around 11/06/2020).    Evelena Peat, MD

## 2020-10-08 LAB — RPR: RPR Ser Ql: NONREACTIVE

## 2020-10-08 LAB — HIV ANTIBODY (ROUTINE TESTING W REFLEX): HIV 1&2 Ab, 4th Generation: NONREACTIVE

## 2020-10-09 LAB — URINE CYTOLOGY ANCILLARY ONLY
Chlamydia: NEGATIVE
Comment: NEGATIVE
Comment: NORMAL
Neisseria Gonorrhea: NEGATIVE

## 2020-11-15 ENCOUNTER — Other Ambulatory Visit: Payer: Self-pay

## 2020-11-15 ENCOUNTER — Emergency Department (HOSPITAL_BASED_OUTPATIENT_CLINIC_OR_DEPARTMENT_OTHER)
Admission: EM | Admit: 2020-11-15 | Discharge: 2020-11-15 | Disposition: A | Discharge status: Home / Self Care | Payer: 59 | Attending: Emergency Medicine | Admitting: Emergency Medicine

## 2020-11-15 ENCOUNTER — Encounter (HOSPITAL_BASED_OUTPATIENT_CLINIC_OR_DEPARTMENT_OTHER): Payer: Self-pay | Admitting: Emergency Medicine

## 2020-11-15 DIAGNOSIS — R197 Diarrhea, unspecified: Secondary | ICD-10-CM | POA: Diagnosis not present

## 2020-11-15 DIAGNOSIS — R9431 Abnormal electrocardiogram [ECG] [EKG]: Secondary | ICD-10-CM | POA: Diagnosis not present

## 2020-11-15 DIAGNOSIS — R1084 Generalized abdominal pain: Secondary | ICD-10-CM | POA: Insufficient documentation

## 2020-11-15 DIAGNOSIS — Z20822 Contact with and (suspected) exposure to covid-19: Secondary | ICD-10-CM | POA: Diagnosis not present

## 2020-11-15 DIAGNOSIS — R6883 Chills (without fever): Secondary | ICD-10-CM | POA: Diagnosis not present

## 2020-11-15 DIAGNOSIS — R112 Nausea with vomiting, unspecified: Secondary | ICD-10-CM | POA: Diagnosis not present

## 2020-11-15 LAB — CBC WITH DIFFERENTIAL/PLATELET
Abs Immature Granulocytes: 0.02 10*3/uL (ref 0.00–0.07)
Basophils Absolute: 0 10*3/uL (ref 0.0–0.1)
Basophils Relative: 1 %
Eosinophils Absolute: 0.1 10*3/uL (ref 0.0–0.5)
Eosinophils Relative: 1 %
HCT: 42.8 % (ref 39.0–52.0)
Hemoglobin: 14.3 g/dL (ref 13.0–17.0)
Immature Granulocytes: 0 %
Lymphocytes Relative: 30 %
Lymphs Abs: 1.9 10*3/uL (ref 0.7–4.0)
MCH: 28.7 pg (ref 26.0–34.0)
MCHC: 33.4 g/dL (ref 30.0–36.0)
MCV: 85.8 fL (ref 80.0–100.0)
Monocytes Absolute: 0.8 10*3/uL (ref 0.1–1.0)
Monocytes Relative: 12 %
Neutro Abs: 3.5 10*3/uL (ref 1.7–7.7)
Neutrophils Relative %: 56 %
Platelets: 318 10*3/uL (ref 150–400)
RBC: 4.99 MIL/uL (ref 4.22–5.81)
RDW: 13.9 % (ref 11.5–15.5)
WBC: 6.2 10*3/uL (ref 4.0–10.5)
nRBC: 0 % (ref 0.0–0.2)

## 2020-11-15 LAB — URINALYSIS, ROUTINE W REFLEX MICROSCOPIC
Bilirubin Urine: NEGATIVE
Glucose, UA: NEGATIVE mg/dL
Ketones, ur: NEGATIVE mg/dL
Leukocytes,Ua: NEGATIVE
Nitrite: NEGATIVE
Protein, ur: NEGATIVE mg/dL
Specific Gravity, Urine: 1.025 (ref 1.005–1.030)
pH: 7 (ref 5.0–8.0)

## 2020-11-15 LAB — COMPREHENSIVE METABOLIC PANEL
ALT: 21 U/L (ref 0–44)
AST: 18 U/L (ref 15–41)
Albumin: 3.9 g/dL (ref 3.5–5.0)
Alkaline Phosphatase: 99 U/L (ref 38–126)
Anion gap: 5 (ref 5–15)
BUN: 12 mg/dL (ref 6–20)
CO2: 28 mmol/L (ref 22–32)
Calcium: 9.3 mg/dL (ref 8.9–10.3)
Chloride: 105 mmol/L (ref 98–111)
Creatinine, Ser: 0.83 mg/dL (ref 0.61–1.24)
GFR, Estimated: >60 mL/min (ref >60)
Glucose, Bld: 101 mg/dL — ABNORMAL HIGH (ref 70–99)
Potassium: 4.1 mmol/L (ref 3.5–5.1)
Sodium: 138 mmol/L (ref 135–145)
Total Bilirubin: 0.7 mg/dL (ref 0.3–1.2)
Total Protein: 7.6 g/dL (ref 6.5–8.1)

## 2020-11-15 LAB — RESP PANEL BY RT-PCR (FLU A&B, COVID) ARPGX2
Influenza A by PCR: NEGATIVE
Influenza B by PCR: NEGATIVE
SARS Coronavirus 2 by RT PCR: NEGATIVE

## 2020-11-15 LAB — URINALYSIS, MICROSCOPIC (REFLEX)

## 2020-11-15 LAB — LIPASE, BLOOD: Lipase: 24 U/L (ref 11–51)

## 2020-11-15 LAB — MAGNESIUM: Magnesium: 1.9 mg/dL (ref 1.7–2.4)

## 2020-11-15 MED ORDER — METOCLOPRAMIDE HCL 5 MG/ML IJ SOLN
10.0000 mg | Freq: Once | INTRAMUSCULAR | Status: AC
  Administered 2020-11-15: 10 mg via INTRAVENOUS
  Filled 2020-11-15: qty 2

## 2020-11-15 MED ORDER — ONDANSETRON 4 MG PO TBDP: 4.0000 mg | ORAL_TABLET | Freq: Three times a day (TID) | ORAL | 0 refills | Status: DC | PRN

## 2020-11-15 MED ORDER — MORPHINE SULFATE (PF) 2 MG/ML IV SOLN
2.0000 mg | Freq: Once | INTRAVENOUS | Status: AC
Start: 2020-11-15 — End: 2020-11-15
  Administered 2020-11-15: 2 mg via INTRAVENOUS
  Filled 2020-11-15: qty 1

## 2020-11-15 MED ORDER — ONDANSETRON 4 MG PO TBDP
4.0000 mg | ORAL_TABLET | Freq: Once | ORAL | Status: AC
  Administered 2020-11-15: 4 mg via ORAL
  Filled 2020-11-15: qty 1

## 2020-11-15 MED ORDER — LACTATED RINGERS IV BOLUS
1000.0000 mL | Freq: Once | INTRAVENOUS | Status: AC
  Administered 2020-11-15: 1000 mL via INTRAVENOUS

## 2020-11-15 NOTE — ED Provider Notes (Signed)
[facility] EMERGENCY DEPARTMENT Provider Note   CSN: [id number] Arrival date & time: 11/15/20  5797     History Chief Complaint  Patient presents with   Chills   Nausea   Emesis    Edwin Garcia is a 40 y.o. male.   Emesis Associated symptoms: chills and diarrhea   Associated symptoms: no abdominal pain, no arthralgias, no cough, no fever, no headaches, no myalgias and no sore throat   Patient presents for abdominal pain and vomiting since 3 AM this morning.  This was around the time of the start of his shift at [employer].  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  He experienced multiple episodes of vomiting.  His coworkers felt that he looked unwell and instructed him to him to the ED.  Patient reports that he felt to be in his normal state of health last night except for some watery diarrhea.  Today, with the vomiting, he has also had generalized abdominal pain.  He did eat some homemade chitlins last night.  He denies any other new or suspicious food intake.  He has not had any known sick contacts.  He denies any history of GI issues.  He states that he has felt like this before when he had COVID-19.  He did take a home COVID-19 test which was negative.  Currently, he endorses continued nausea with some mild generalized abdominal pain.  He denies any chest discomfort, shortness of breath, headache, back pain, or urinary symptoms.  Since the onset of his symptoms, he has tried sips of water but has subsequently vomited.  He drinks alcohol occasionally.  Last alcohol use was 2 days ago    Past Medical History:  Diagnosis Date   Allergy    APHTHOUS ULCERS 05/30/2009   Bulimia    younger adult   CHALAZION, LEFT 05/30/2009   DEPRESSION 11/01/2008   Panic attack    Syphilis     Patient Active Problem List   Diagnosis Date Noted   LBBB (left bundle branch block) 11/19/2015   Exposure to HIV 11/19/2015   Obesity, Class I,  BMI 30-34.9 11/19/2015   Genital herpes 11/02/2011   History of syphilis 08/18/2011   Depression 11/01/2008    Past Surgical History:  Procedure Laterality Date   ANKLE SURGERY  1999   RIGHT   TONSILLECTOMY     REMOVED AT 40 YRS OLD       Family History  Problem Relation Age of Onset   Hyperlipidemia Mother    Healthy Mother    Other Father        smoker- lung nodule   Hypertension Maternal Grandmother 33   Diabetes Maternal Grandmother    Diabetes Maternal Grandfather 61       TYPE II   Hypertension Maternal Grandfather 45   Hypertension Paternal Grandmother    Cirrhosis Paternal Grandfather        cirrhosis alcohol related, also smoker- ? lung cancer   Alcohol abuse Neg Hx        family   Arthritis Neg Hx        family   Cancer Neg Hx        family hx - breast ,lung,colon,prostate ca   Depression Neg Hx        family    Social History   Tobacco Use   Smoking status: Never   Smokeless tobacco: Never  Vaping Use   Vaping  Use: Former  Substance Use Topics   Alcohol use: Yes    Comment: 7   Drug use: No    Home Medications Prior to Admission medications   Medication Sig Start Date End Date Taking? Authorizing Provider  ondansetron (ZOFRAN ODT) 4 MG disintegrating tablet Take 1 tablet (4 mg total) by mouth every 8 (eight) hours as needed for nausea or vomiting. 11/15/20  Yes Gloris Manchester, MD  ibuprofen (ADVIL) 600 MG tablet Take 1 tablet (600 mg total) by mouth every 6 (six) hours as needed. 05/21/20   Joy, Shawn C, PA-C  ketoconazole (NIZORAL) 2 % shampoo Apply 1 application topically 2 (two) times a week. Use for 3 weeks.  Leave shampoo on for 3-5 minutes before rinsing. 06/25/15   Santiago Glad, PA-C  lidocaine (LIDODERM) 5 % Place 1 patch onto the skin daily. Remove & Discard patch within 12 hours or as directed by MD Patient not taking: Reported on 10/07/2020 05/21/20   Joy, Hillard Danker, PA-C  valACYclovir (VALTREX) 500 MG tablet Take one tablet BID for 3 days prn  outbreak. 01/30/20   Burchette, Elberta Fortis, MD    Allergies    Cephalexin  Review of Systems   Review of Systems  Constitutional:  Positive for appetite change and chills. Negative for fever.  HENT:  Negative for ear pain and sore throat.   Eyes:  Negative for pain and visual disturbance.  Respiratory:  Negative for cough, chest tightness and shortness of breath.   Cardiovascular:  Negative for chest pain and palpitations.  Gastrointestinal:  Positive for diarrhea, nausea and vomiting. Negative for abdominal distention, abdominal pain and blood in stool.  Genitourinary:  Negative for dysuria, flank pain, hematuria, penile discharge and testicular pain.  Musculoskeletal:  Negative for arthralgias, back pain, joint swelling, myalgias and neck pain.  Skin:  Negative for color change and rash.  Neurological:  Negative for dizziness, seizures, syncope, weakness, light-headedness, numbness and headaches.  Psychiatric/Behavioral:  Negative for confusion and decreased concentration.   All other systems reviewed and are negative.  Physical Exam Updated Vital Signs BP (!) 159/107 (BP Location: Right Arm)   Pulse 82   Temp 98.6 F (37 C) (Oral)   Resp 18   Ht 5\' 6"  (1.676 m)   Wt 97.1 kg   SpO2 99%   BMI 34.54 kg/m   Physical Exam Vitals and nursing note reviewed.  Constitutional:      General: He is not in acute distress.    Appearance: Normal appearance. He is well-developed. He is not ill-appearing, toxic-appearing or diaphoretic.  HENT:     Head: Normocephalic and atraumatic.     Right Ear: External ear normal.     Left Ear: External ear normal.     Nose: Nose normal.     Mouth/Throat:     Mouth: Mucous membranes are moist.     Pharynx: Oropharynx is clear.  Eyes:     General: No scleral icterus.    Conjunctiva/sclera: Conjunctivae normal.  Cardiovascular:     Rate and Rhythm: Normal rate and regular rhythm.     Heart sounds: No murmur heard. Pulmonary:     Effort:  Pulmonary effort is normal. No respiratory distress.     Breath sounds: Normal breath sounds. No wheezing or rales.  Abdominal:     Palpations: Abdomen is soft.     Tenderness: There is abdominal tenderness (Mild, generalized). There is no right CVA tenderness or left CVA tenderness.  Musculoskeletal:  General: Normal range of motion.     Cervical back: Normal range of motion and neck supple. No rigidity.     Right lower leg: No edema.     Left lower leg: No edema.  Skin:    General: Skin is warm and dry.     Coloration: Skin is not jaundiced or pale.  Neurological:     General: No focal deficit present.     Mental Status: He is alert and oriented to person, place, and time.     Cranial Nerves: No cranial nerve deficit.     Sensory: No sensory deficit.     Motor: No weakness.  Psychiatric:        Mood and Affect: Mood normal.        Behavior: Behavior normal.        Thought Content: Thought content normal.        Judgment: Judgment normal.    ED Results / Procedures / Treatments   Labs (all labs ordered are listed, but only abnormal results are displayed) Labs Reviewed  COMPREHENSIVE METABOLIC PANEL - Abnormal; Notable for the following components:      Result Value   Glucose, Bld 101 (*)    All other components within normal limits  URINALYSIS, ROUTINE W REFLEX MICROSCOPIC - Abnormal; Notable for the following components:   Hgb urine dipstick TRACE (*)    All other components within normal limits  URINALYSIS, MICROSCOPIC (REFLEX) - Abnormal; Notable for the following components:   Bacteria, UA FEW (*)    All other components within normal limits  RESP PANEL BY RT-PCR (FLU A&B, COVID) ARPGX2  LIPASE, BLOOD  CBC WITH DIFFERENTIAL/PLATELET  MAGNESIUM    EKG EKG Interpretation  Date/Time:  Saturday November 15 2020 08:01:56 EDT Ventricular Rate:  77 PR Interval:  147 QRS Duration: 154 QT Interval:  427 QTC Calculation: 484 R Axis:   58 Text  Interpretation: Sinus rhythm Left bundle branch block No change from prior EKG Confirmed by Gloris Manchester ([id number]) on 11/15/2020 8:38:14 AM  Radiology No results found.  Procedures Procedures   Medications Ordered in ED Medications  lactated ringers bolus 1,000 mL (0 mLs Intravenous Stopped 11/15/20 0837)  metoCLOPramide (REGLAN) injection 10 mg (10 mg Intravenous Given 11/15/20 0734)  morphine 2 MG/ML injection 2 mg (2 mg Intravenous Given 11/15/20 0737)  ondansetron (ZOFRAN-ODT) disintegrating tablet 4 mg (4 mg Oral Given 11/15/20 2536)    ED Course  I have reviewed the triage vital signs and the nursing notes.  Pertinent labs & imaging results that were available during my care of the patient were reviewed by me and considered in my medical decision making (see chart for details).    MDM Rules/Calculators/A&P                         Patient presents for nausea, vomiting, and generalized abdominal pain.  Onset of symptoms was 4 hours prior to arrival.  Prior to that, he did have some watery diarrhea last night but has not had any other preceding symptoms.  Afebrile with normal heart rate upon arrival.  Given his p.o. intolerance, bolus of IV fluids was ordered.  Laboratory work-up was initiated.  Patient was given Reglan for symptomatic relief of nausea.  Lab work was reassuring.  On reassessment, patient reported improved symptoms.  During his 4-hour observation in the ED, no further episodes of diarrhea or vomiting.  He was able to tolerate p.o.  As  needed Zofran was prescribed.  Patient was discharged in stable condition.  Final Clinical Impression(s) / ED Diagnoses Final diagnoses:  Nausea vomiting and diarrhea    Rx / DC Orders ED Discharge Orders          Ordered    ondansetron (ZOFRAN ODT) 4 MG disintegrating tablet  Every 8 hours PRN        11/15/20 1032             Gloris Manchester, MD 11/15/20 1906

## 2020-11-15 NOTE — ED Triage Notes (Signed)
Pt state emisis since 3 am, abdomal pain, chills.

## 2020-11-15 NOTE — Discharge Instructions (Signed)
There was a prescription for Zofran sent to your preferred pharmacy.  Take this only as needed for nausea and vomiting.  Continue to drink plenty of fluids to replace volume losses.  If you have persistent symptoms beyond tonight, please return to the emergency department.

## 2020-11-27 ENCOUNTER — Telehealth: Payer: Self-pay | Admitting: Family Medicine

## 2020-11-27 NOTE — Telephone Encounter (Signed)
[  1:00 PM] Edwin Garcia, [name] is the pt that want a copy of his flu shot and he call back with a fax # it is [phone].  Proof of flu vaccine sent to number above.

## 2020-11-27 NOTE — Telephone Encounter (Signed)
LVM instructions for pt to return call with fax number as office scanner is disabled at this time.

## 2020-11-27 NOTE — Telephone Encounter (Signed)
Pt call and stated he need a copy of his flu shot report e-mail to [email or web address]

## 2021-10-09 ENCOUNTER — Encounter: Payer: 59 | Admitting: Family Medicine

## 2021-10-21 ENCOUNTER — Encounter: Payer: Self-pay | Admitting: Family Medicine

## 2021-10-21 ENCOUNTER — Ambulatory Visit (INDEPENDENT_AMBULATORY_CARE_PROVIDER_SITE_OTHER): Payer: 59 | Admitting: Family Medicine

## 2021-10-21 VITALS — BP 132/88 | HR 75 | Temp 97.7°F | Ht 65.75 in | Wt 211.9 lb

## 2021-10-21 DIAGNOSIS — Z0000 Encounter for general adult medical examination without abnormal findings: Secondary | ICD-10-CM

## 2021-10-21 LAB — LIPID PANEL
Cholesterol: 186 mg/dL (ref 0–200)
HDL: 45.4 mg/dL (ref >39.00)
LDL Cholesterol: 124 mg/dL — ABNORMAL HIGH (ref 0–99)
NonHDL: 140.42
Total CHOL/HDL Ratio: 4
Triglycerides: 83 mg/dL (ref 0.0–149.0)
VLDL: 16.6 mg/dL (ref 0.0–40.0)

## 2021-10-21 LAB — CBC WITH DIFFERENTIAL/PLATELET
Basophils Absolute: 0 10*3/uL (ref 0.0–0.1)
Basophils Relative: 0.5 % (ref 0.0–3.0)
Eosinophils Absolute: 0.1 10*3/uL (ref 0.0–0.7)
Eosinophils Relative: 1.4 % (ref 0.0–5.0)
HCT: 41.8 % (ref 39.0–52.0)
Hemoglobin: 14 g/dL (ref 13.0–17.0)
Lymphocytes Relative: 30.7 % (ref 12.0–46.0)
Lymphs Abs: 1.5 10*3/uL (ref 0.7–4.0)
MCHC: 33.5 g/dL (ref 30.0–36.0)
MCV: 87 fl (ref 78.0–100.0)
Monocytes Absolute: 0.6 10*3/uL (ref 0.1–1.0)
Monocytes Relative: 11.8 % (ref 3.0–12.0)
Neutro Abs: 2.8 10*3/uL (ref 1.4–7.7)
Neutrophils Relative %: 55.6 % (ref 43.0–77.0)
Platelets: 291 10*3/uL (ref 150.0–400.0)
RBC: 4.8 Mil/uL (ref 4.22–5.81)
RDW: 14.5 % (ref 11.5–15.5)
WBC: 5 10*3/uL (ref 4.0–10.5)

## 2021-10-21 LAB — BASIC METABOLIC PANEL
BUN: 10 mg/dL (ref 6–23)
CO2: 27 mEq/L (ref 19–32)
Calcium: 9.4 mg/dL (ref 8.4–10.5)
Chloride: 102 mEq/L (ref 96–112)
Creatinine, Ser: 0.99 mg/dL (ref 0.40–1.50)
GFR: 96.34 mL/min (ref >60.00)
Glucose, Bld: 96 mg/dL (ref 70–99)
Potassium: 4.1 mEq/L (ref 3.5–5.1)
Sodium: 136 mEq/L (ref 135–145)

## 2021-10-21 LAB — HEPATIC FUNCTION PANEL
ALT: 16 U/L (ref 0–53)
AST: 17 U/L (ref 0–37)
Albumin: 4.3 g/dL (ref 3.5–5.2)
Alkaline Phosphatase: 91 U/L (ref 39–117)
Bilirubin, Direct: 0.1 mg/dL (ref 0.0–0.3)
Total Bilirubin: 0.6 mg/dL (ref 0.2–1.2)
Total Protein: 7.5 g/dL (ref 6.0–8.3)

## 2021-10-21 LAB — TSH: TSH: 1.62 u[IU]/mL (ref 0.35–5.50)

## 2021-10-21 LAB — HEMOGLOBIN A1C: Hgb A1c MFr Bld: 5.6 % (ref 4.6–6.5)

## 2021-10-21 NOTE — Progress Notes (Signed)
Established Patient Office Visit  Subjective   Patient ID: Edwin Garcia, male    DOB: [birth year]  Age: 41 y.o. MRN: [id number]  Chief Complaint  Patient presents with   Annual Exam    HPI   Here for physical exam.  He has made some positive lifestyle changes and has lost a few pounds from last year.  Has had some issues with elevated blood pressure.  He recalls 1 time elevation of 168/108 this was after drinking a little more alcohol than usual and also increased salt intake.  Blood pressures have generally been ranging less than [id number] systolic since then.  He tries to watch sodium intake.  Continues to stay very busy working several jobs.  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  Works at [employer] in [place] and also part-time at the [place] and with city of [place].  Non-smoker.  Frequently drinks tequila most days.  Family history-significant for type 2 diabetes and hypertension maternal grandmother.  Maternal grandfather also had diabetes.  Hypertension in parents and grandparents  Past Medical History:  Diagnosis Date   Allergy    APHTHOUS ULCERS 05/30/2009   Bulimia    younger adult   CHALAZION, LEFT 05/30/2009   DEPRESSION 11/01/2008   Panic attack    Syphilis    Past Surgical History:  Procedure Laterality Date   ANKLE SURGERY  1999   RIGHT   TONSILLECTOMY     REMOVED AT 41 YRS OLD    reports that he has never smoked. He has never used smokeless tobacco. He reports current alcohol use. He reports that he does not use drugs. family history includes Cirrhosis in his paternal grandfather; Diabetes in his maternal grandmother; Diabetes (age of onset: 30) in his maternal grandfather; Healthy in his mother;  Hyperlipidemia in his mother; Hypertension in his paternal grandmother; Hypertension (age of onset: 80) in his maternal grandmother; Hypertension (age of onset: 92) in his maternal grandfather; Other in his father. Allergies  Allergen Reactions   Cephalexin Nausea Only    headache     Review of Systems  Constitutional:  Negative for chills, fever, malaise/fatigue and weight loss.  HENT:  Negative for hearing loss.   Eyes:  Negative for blurred vision and double vision.  Respiratory:  Negative for cough and shortness of breath.   Cardiovascular:  Negative for chest pain, palpitations and leg swelling.  Gastrointestinal:  Negative for abdominal pain, blood in stool, constipation and diarrhea.  Genitourinary:  Negative for dysuria.  Skin:  Negative for rash.  Neurological:  Negative for dizziness, speech change, seizures, loss of consciousness and headaches.  Psychiatric/Behavioral:  Negative for depression.       Objective:     BP 132/88 (BP Location: Left Arm, Cuff Size: Normal)   Pulse 75   Temp 97.7 F (36.5 C) (Oral)   Ht 5' 5.75" (1.67 m)   Wt 211 lb 14.4 oz (96.1 kg)   SpO2 97%   BMI 34.46 kg/m    Physical Exam Vitals reviewed.  Constitutional:      General: He is not in acute distress.    Appearance: He is well-developed.  HENT:     Head: Normocephalic and atraumatic.     Right Ear:  External ear normal.     Left Ear: External ear normal.  Eyes:     Conjunctiva/sclera: Conjunctivae normal.     Pupils: Pupils are equal, round, and reactive to light.  Neck:     Thyroid: No thyromegaly.  Cardiovascular:     Rate and Rhythm: Normal rate and regular rhythm.     Heart sounds: Normal heart sounds. No murmur heard. Pulmonary:     Effort: No respiratory distress.     Breath sounds: No wheezing or rales.  Abdominal:     General: Bowel sounds are normal. There is no distension.     Palpations: Abdomen is soft. There is no mass.     Tenderness: There is no abdominal  tenderness. There is no guarding or rebound.  Musculoskeletal:     Cervical back: Normal range of motion and neck supple.     Right lower leg: No edema.     Left lower leg: No edema.  Lymphadenopathy:     Cervical: No cervical adenopathy.  Skin:    Findings: No rash.  Neurological:     Mental Status: He is alert and oriented to person, place, and time.     Cranial Nerves: No cranial nerve deficit.     Deep Tendon Reflexes: Reflexes normal.      No results found for any visits on 10/21/21.    The ASCVD Risk score (Arnett DK, et al., 2019) failed to calculate for the following reasons:   The 2019 ASCVD risk score is only valid for ages 58 to 25    Assessment & Plan:   Problem List Items Addressed This Visit   None Visit Diagnoses     Physical exam    -  Primary   Relevant Orders   Basic metabolic panel   Lipid panel   CBC with Differential/Platelet   TSH   Hepatic function panel   Hemoglobin A1c     We discussed the following health maintenance items  -Flu vaccine already given -Need tetanus booster next year -Can continue healthy lifestyle habits.  Recommend continue weight loss.  Handout on DASH diet given. -New close monitoring of blood pressure and be in touch if blood pressure readings consistently greater than 140/90 -Obtain screening labs as above including A1c with positive family history of diabetes -Consider PSA screening by age 54  No follow-ups on file.    Carolann Littler, MD

## 2021-11-02 ENCOUNTER — Ambulatory Visit: Payer: 59 | Admitting: Family Medicine

## 2021-11-02 ENCOUNTER — Encounter: Payer: Self-pay | Admitting: Family Medicine

## 2021-11-02 VITALS — BP 142/90 | HR 76 | Temp 97.6°F | Ht 65.75 in | Wt 206.6 lb

## 2021-11-02 DIAGNOSIS — I10 Essential (primary) hypertension: Secondary | ICD-10-CM | POA: Diagnosis not present

## 2021-11-02 MED ORDER — DILTIAZEM HCL ER COATED BEADS 120 MG PO CP24: 120.0000 mg | ORAL_CAPSULE | Freq: Every day | ORAL | 5 refills | Status: DC

## 2021-11-02 NOTE — Progress Notes (Signed)
Established Patient Office Visit  Subjective   Patient ID: Edwin Garcia, male    DOB: 1980/03/17  Age: 41 y.o. MRN: [id number]  Chief Complaint  Patient presents with   Hypertension    HPI   Edwin Garcia is seen for follow-up regarding elevated blood pressures.  He states he has had several elevated blood pressure spikes.  Last week he had 159/103 with concomitant headache.  Headache went away after blood pressure came back down.  He had some borderline elevated readings for some time.  Has been recently losing some weight and would like to get down eventually to 180 pounds.  Tries to watch sodium intake.  Past Medical History:  Diagnosis Date   Allergy    APHTHOUS ULCERS 05/30/2009   Bulimia    younger adult   CHALAZION, LEFT 05/30/2009   DEPRESSION 11/01/2008   Panic attack    Syphilis    Past Surgical History:  Procedure Laterality Date   ANKLE SURGERY  1999   RIGHT   TONSILLECTOMY     REMOVED AT 41 YRS OLD    reports that he has never smoked. He has never used smokeless tobacco. He reports current alcohol use. He reports that he does not use drugs. family history includes Cirrhosis in his paternal grandfather; Diabetes in his maternal grandmother; Diabetes (age of onset: 40) in his maternal grandfather; Healthy in his mother; Hyperlipidemia in his mother; Hypertension in his paternal grandmother; Hypertension (age of onset: 38) in his maternal grandmother; Hypertension (age of onset: 16) in his maternal grandfather; Other in his father. Allergies  Allergen Reactions   Cephalexin Nausea Only    headache    Review of Systems  Constitutional:  Negative for malaise/fatigue.  Eyes:  Negative for blurred vision.  Respiratory:  Negative for shortness of breath.   Cardiovascular:  Negative for chest pain.  Neurological:  Positive for headaches. Negative for dizziness and weakness.      Objective:     BP (!) 142/90 (BP Location: Left Arm, Cuff Size: Normal)   Pulse 76    Temp 97.6 F (36.4 C) (Oral)   Ht 5' 5.75" (1.67 m)   Wt 206 lb 9.6 oz (93.7 kg)   SpO2 98%   BMI 33.60 kg/m    Physical Exam Vitals reviewed.  Constitutional:      Appearance: He is well-developed.  Eyes:     Pupils: Pupils are equal, round, and reactive to light.  Neck:     Thyroid: No thyromegaly.  Cardiovascular:     Rate and Rhythm: Normal rate and regular rhythm.  Pulmonary:     Effort: Pulmonary effort is normal. No respiratory distress.     Breath sounds: Normal breath sounds. No wheezing or rales.  Musculoskeletal:     Cervical back: Neck supple.  Neurological:     Mental Status: He is alert and oriented to person, place, and time.      No results found for any visits on 11/02/21.    The ASCVD Risk score (Arnett DK, et al., 2019) failed to calculate for the following reasons:   The 2019 ASCVD risk score is only valid for ages 67 to 73    Assessment & Plan:   Elevated blood pressure.  He has had several spikes recently in blood pressure with symptoms including headache.  Blood pressure up today and borderline elevated recently with physical.  -We continue to stress nonpharmacologic factors with weight control and sodium reduction as well as regular aerobic  exercise.  Suspect with adequate weight loss his blood pressure will come down to normal. -We discussed going ahead and initiating treatment with medication.  We decided to go and start Cardizem CD 120 mg once daily.  Set up 1 month follow-up to reassess.  Return in about 1 month (around 12/03/2021).    Carolann Littler, MD

## 2021-12-07 ENCOUNTER — Telehealth: Payer: Self-pay | Admitting: Family Medicine

## 2021-12-07 NOTE — Telephone Encounter (Signed)
I spoke with the patient and he reports sporadic BP readings of about 144/80-90. Patient states he checks blood pressure occasionally but notices he starts to feel bad when his blood pressure is elevated.

## 2021-12-07 NOTE — Telephone Encounter (Signed)
Patient says his blood pressure spikes every now and then and wondering if he needs to adjust his dosage diltiazem (CARDIZEM CD) 120 MG 24 hr capsule

## 2021-12-08 MED ORDER — DILTIAZEM HCL ER COATED BEADS 240 MG PO CP24: 240.0000 mg | ORAL_CAPSULE | Freq: Every day | ORAL | 0 refills | Status: DC

## 2021-12-08 NOTE — Telephone Encounter (Signed)
Patient informed of the message and rx was sent  

## 2021-12-08 NOTE — Addendum Note (Signed)
Addended by: Christy Sartorius on: 12/08/2021 01:31 PM   Modules accepted: Orders

## 2022-01-26 ENCOUNTER — Ambulatory Visit: Payer: 59 | Admitting: Family Medicine

## 2022-01-26 ENCOUNTER — Encounter: Payer: Self-pay | Admitting: Family Medicine

## 2022-01-26 VITALS — BP 130/80 | HR 75 | Temp 98.2°F | Ht 65.75 in | Wt 209.2 lb

## 2022-01-26 DIAGNOSIS — R0981 Nasal congestion: Secondary | ICD-10-CM

## 2022-01-26 DIAGNOSIS — R051 Acute cough: Secondary | ICD-10-CM | POA: Diagnosis not present

## 2022-01-26 DIAGNOSIS — J069 Acute upper respiratory infection, unspecified: Secondary | ICD-10-CM

## 2022-01-26 LAB — POC COVID19 BINAXNOW: SARS Coronavirus 2 Ag: NEGATIVE

## 2022-01-26 MED ORDER — HYDROCODONE BIT-HOMATROP MBR 5-1.5 MG/5ML PO SOLN: 5.0000 mL | Freq: Three times a day (TID) | ORAL | 0 refills | Status: DC | PRN

## 2022-01-26 NOTE — Progress Notes (Signed)
Established Patient Office Visit  Subjective   Patient ID: Edwin Garcia, male    DOB: 1980-04-25  Age: 42 y.o. MRN: [id number]  Chief Complaint  Patient presents with   Cough    Patient complains of cough, Productive cough, x4 days, Tried Mucinex with little relief    Nasal Congestion    Patient complains of nasal congestion, x4 days,    Diarrhea    Patient complains of diarrhea,     HPI   Edwin Garcia is seen with onset this past Friday of nasal congestion, sore throat, body aches, malaise.  He initially had some diarrhea which is now improved.  No fever.  Feels some better at this time.  Cough still severe at times and interfering with sleep.  Cough not relieved with over-the-counter medications.  Past Medical History:  Diagnosis Date   Allergy    APHTHOUS ULCERS 05/30/2009   Bulimia    younger adult   75, LEFT 05/30/2009   DEPRESSION 11/01/2008   Panic attack    Syphilis    Past Surgical History:  Procedure Laterality Date   ANKLE SURGERY  1999   RIGHT   TONSILLECTOMY     REMOVED AT 42 YRS OLD    reports that he has never smoked. He has never used smokeless tobacco. He reports current alcohol use. He reports that he does not use drugs. family history includes Cirrhosis in his paternal grandfather; Diabetes in his maternal grandmother; Diabetes (age of onset: 79) in his maternal grandfather; Healthy in his mother; Hyperlipidemia in his mother; Hypertension in his paternal grandmother; Hypertension (age of onset: 34) in his maternal grandmother; Hypertension (age of onset: 12) in his maternal grandfather; Other in his father. Allergies  Allergen Reactions   Cephalexin Nausea Only    headache    Review of Systems  Constitutional:  Negative for chills and fever.  HENT:  Positive for congestion and sore throat.   Respiratory:  Positive for cough. Negative for hemoptysis, shortness of breath and wheezing.       Objective:     BP 130/80 (BP Location: Left  Arm, Patient Position: Sitting, Cuff Size: Normal)   Pulse 75   Temp 98.2 F (36.8 C) (Oral)   Ht 5' 5.75" (1.67 m)   Wt 209 lb 3.2 oz (94.9 kg)   SpO2 98%   BMI 34.02 kg/m    Physical Exam Vitals reviewed.  Constitutional:      General: He is not in acute distress.    Appearance: He is not ill-appearing.  Cardiovascular:     Rate and Rhythm: Normal rate and regular rhythm.  Pulmonary:     Effort: Pulmonary effort is normal.     Breath sounds: Normal breath sounds. No wheezing or rales.  Musculoskeletal:     Cervical back: Neck supple.  Lymphadenopathy:     Cervical: No cervical adenopathy.  Neurological:     Mental Status: He is alert.      Results for orders placed or performed in visit on 01/26/22  POC COVID-19 BinaxNow  Result Value Ref Range   SARS Coronavirus 2 Ag Negative Negative      The ASCVD Risk score (Arnett DK, et al., 2019) failed to calculate for the following reasons:   The 2019 ASCVD risk score is only valid for ages 60 to 5    Assessment & Plan:   Problem List Items Addressed This Visit   None Visit Diagnoses     Acute cough    -  Primary   Relevant Orders   POC COVID-19 BinaxNow (Completed)   Nasal congestion       Relevant Orders   POC COVID-19 BinaxNow (Completed)     COVID test negative.  Suspect viral URI with cough.  Patient requesting cough medication.  We wrote for Hycodan cough syrup 1 teaspoon nightly as needed for severe cough.  Follow-up promptly for any fever or increased dyspnea  No follow-ups on file.    Evelena Peat, MD

## 2022-03-10 ENCOUNTER — Other Ambulatory Visit: Payer: Self-pay | Admitting: Family Medicine

## 2022-04-07 ENCOUNTER — Other Ambulatory Visit (HOSPITAL_COMMUNITY)
Admission: RE | Admit: 2022-04-07 | Discharge: 2022-04-07 | Disposition: A | Discharge status: Home / Self Care | Payer: Commercial Managed Care - PPO | Source: Ambulatory Visit | Attending: Family Medicine | Admitting: Family Medicine

## 2022-04-07 ENCOUNTER — Ambulatory Visit (INDEPENDENT_AMBULATORY_CARE_PROVIDER_SITE_OTHER): Payer: 59 | Admitting: Family Medicine

## 2022-04-07 ENCOUNTER — Encounter: Payer: Self-pay | Admitting: Family Medicine

## 2022-04-07 VITALS — BP 140/90 | HR 70 | Temp 97.8°F | Ht 65.75 in | Wt 207.6 lb

## 2022-04-07 DIAGNOSIS — Z113 Encounter for screening for infections with a predominantly sexual mode of transmission: Secondary | ICD-10-CM

## 2022-04-07 DIAGNOSIS — I10 Essential (primary) hypertension: Secondary | ICD-10-CM

## 2022-04-07 DIAGNOSIS — E669 Obesity, unspecified: Secondary | ICD-10-CM

## 2022-04-07 NOTE — Progress Notes (Unsigned)
Established Patient Office Visit  Subjective   Patient ID: Edwin Garcia, male    DOB: 1980/12/05  Age: 42 y.o. MRN: [id number]  No chief complaint on file.   HPI  {History (Optional):23778} Loc is seen to discuss the following items  He is requesting STD screening.  Has been screened multiple times in the past.  Denies any symptoms such as rash or dysuria but has had some new partners and is wanting to make sure he has no "silent "infection at this time.  He has hypertension and we recently started diltiazem 120 mg daily and subsequent titration to 240 mg.  He states his blood pressures have been improved by home readings until this week.  Over the weekend he had several celebrations and drank more alcohol than usual and that has extended into the week.  He thinks this is exacerbating his blood pressure.  Also eats out fairly often and knows he needs to reduce sodium intake.  Has had several readings [id number] or less systolic since titration of diltiazem.  His obesity with BMI over 33.  He has struggled to lose weight over time.  Has questions about GLP-1 medications.  No history of diabetes or prediabetes by the previous A1c's.  Past Medical History:  Diagnosis Date   Allergy    APHTHOUS ULCERS 05/30/2009   Bulimia    younger adult   15, LEFT 05/30/2009   DEPRESSION 11/01/2008   Panic attack    Syphilis    Past Surgical History:  Procedure Laterality Date   ANKLE SURGERY  1999   RIGHT   TONSILLECTOMY     REMOVED AT 42 YRS OLD    reports that he has never smoked. He has never used smokeless tobacco. He reports current alcohol use. He reports that he does not use drugs. family history includes Cirrhosis in his paternal grandfather; Diabetes in his maternal grandmother; Diabetes (age of onset: 64) in his maternal grandfather; Healthy in his mother; Hyperlipidemia in his mother; Hypertension in his paternal grandmother; Hypertension (age of onset: 55) in his maternal  grandmother; Hypertension (age of onset: 39) in his maternal grandfather; Other in his father. Allergies  Allergen Reactions   Cephalexin Nausea Only    headache    Review of Systems  Constitutional:  Negative for malaise/fatigue.  Eyes:  Negative for blurred vision.  Respiratory:  Negative for shortness of breath.   Cardiovascular:  Negative for chest pain.  Genitourinary:  Negative for dysuria and hematuria.  Skin:  Negative for rash.  Neurological:  Negative for dizziness, weakness and headaches.      Objective:     BP (!) 140/90 (BP Location: Left Arm, Cuff Size: Normal)   Pulse 70   Temp 97.8 F (36.6 C) (Oral)   Ht 5' 5.75" (1.67 m)   Wt 207 lb 9.6 oz (94.2 kg)   SpO2 98%   BMI 33.76 kg/m  BP Readings from Last 3 Encounters:  04/07/22 (!) 140/90  01/26/22 130/80  11/02/21 (!) 142/90   Wt Readings from Last 3 Encounters:  04/07/22 207 lb 9.6 oz (94.2 kg)  01/26/22 209 lb 3.2 oz (94.9 kg)  11/02/21 206 lb 9.6 oz (93.7 kg)      Physical Exam Vitals reviewed.  Constitutional:      Appearance: He is well-developed.  HENT:     Right Ear: External ear normal.     Left Ear: External ear normal.  Eyes:     Pupils: Pupils are equal, round,  and reactive to light.  Neck:     Thyroid: No thyromegaly.  Cardiovascular:     Rate and Rhythm: Normal rate and regular rhythm.  Pulmonary:     Effort: Pulmonary effort is normal. No respiratory distress.     Breath sounds: Normal breath sounds. No wheezing or rales.  Musculoskeletal:     Cervical back: Neck supple.  Neurological:     Mental Status: He is alert and oriented to person, place, and time.      No results found for any visits on 04/07/22.  {Labs (Optional):23779}  The ASCVD Risk score (Arnett DK, et al., 2019) failed to calculate for the following reasons:   The 2019 ASCVD risk score is only valid for ages 20 to 58    Assessment & Plan:   #1 hypertension.  Suboptimally controlled today.  Did come  down a fair amount after rest.  Home readings have been much better controlled.  Increased alcohol consumption last weekend into this week.  May be exacerbating somewhat.  -We discussed nonpharmacologic management with weight loss, sodium reduction, regular aerobic exercise -Continue to monitor closely and be in touch if not consistently less than 130/80 -Consider possibly low-dose thiazide if still up by home readings  #2 obesity.  BMI over 33.  Comorbidities of hypertension and hyperlipidemia.  Blood sugars have been normal.  He specifically inquires about GLP-1 medications.  Has never tried these previously.  He is not sure regarding insurance coverage.  We explained that insurance coverage may be challenging but he will check with insurance company   Return in about 3 months (around 07/08/2022).    Carolann Littler, MD

## 2022-04-07 NOTE — Patient Instructions (Signed)
Monitor blood pressure and be in touch if consistently > 130/80  Weight loss medication options:  Ozempic, Wegovy, Mounjaro, or Zepbound.

## 2022-04-08 DIAGNOSIS — I10 Essential (primary) hypertension: Secondary | ICD-10-CM | POA: Insufficient documentation

## 2022-04-08 LAB — HIV ANTIBODY (ROUTINE TESTING W REFLEX): HIV 1&2 Ab, 4th Generation: NONREACTIVE

## 2022-04-08 LAB — RPR: RPR Ser Ql: NONREACTIVE

## 2022-04-09 LAB — URINE CYTOLOGY ANCILLARY ONLY
Chlamydia: NEGATIVE
Comment: NEGATIVE
Comment: NORMAL
Neisseria Gonorrhea: NEGATIVE

## 2022-04-15 ENCOUNTER — Ambulatory Visit (INDEPENDENT_AMBULATORY_CARE_PROVIDER_SITE_OTHER): Payer: 59 | Admitting: Internal Medicine

## 2022-04-15 VITALS — BP 126/72 | HR 82 | Temp 98.2°F | Ht 65.75 in | Wt 203.0 lb

## 2022-04-15 DIAGNOSIS — I10 Essential (primary) hypertension: Secondary | ICD-10-CM | POA: Diagnosis not present

## 2022-04-15 DIAGNOSIS — R051 Acute cough: Secondary | ICD-10-CM

## 2022-04-15 DIAGNOSIS — J069 Acute upper respiratory infection, unspecified: Secondary | ICD-10-CM

## 2022-04-15 MED ORDER — AZITHROMYCIN 250 MG PO TABS: ORAL_TABLET | ORAL | 1 refills | Status: AC

## 2022-04-15 MED ORDER — GUAIFENESIN-CODEINE 100-10 MG/5ML PO SOLN: 5.0000 mL | Freq: Four times a day (QID) | ORAL | 0 refills | Status: DC | PRN

## 2022-04-15 NOTE — Progress Notes (Unsigned)
Patient ID: Edwin Garcia, male   DOB: 23-Nov-1980, 42 y.o.   MRN: [id number]        Chief Complaint: follow up URI symptoms, htn       HPI:  Edwin Garcia is a 42 y.o. male  Here with 2-3 days acute onset fever, facial pain, pressure, headache, general weakness and malaise, and greenish d/c, with mild ST and cough, but pt denies chest pain, wheezing, increased sob or doe, orthopnea, PND, increased LE swelling, palpitations, dizziness or syncope.   Pt denies polydipsia, polyuria, or new focal neuro s/s.    Pt denies wt loss, night sweats, loss of appetite, or other constitutional symptoms        Wt Readings from Last 3 Encounters:  04/15/22 203 lb (92.1 kg)  04/07/22 207 lb 9.6 oz (94.2 kg)  01/26/22 209 lb 3.2 oz (94.9 kg)   BP Readings from Last 3 Encounters:  04/15/22 126/72  04/07/22 (!) 140/90  01/26/22 130/80         Past Medical History:  Diagnosis Date   Allergy    APHTHOUS ULCERS 05/30/2009   Bulimia    younger adult   79, LEFT 05/30/2009   DEPRESSION 11/01/2008   Panic attack    Syphilis    Past Surgical History:  Procedure Laterality Date   ANKLE SURGERY  1999   RIGHT   TONSILLECTOMY     REMOVED AT [place]    reports that he has never smoked. He has never used smokeless tobacco. He reports current alcohol use. He reports that he does not use drugs. family history includes Cirrhosis in his paternal grandfather; Diabetes in his maternal grandmother; Diabetes (age of onset: 76) in his maternal grandfather; Healthy in his mother; Hyperlipidemia in his mother; Hypertension in his paternal grandmother; Hypertension (age of onset: 64) in his maternal grandmother; Hypertension (age of onset: 69) in his maternal grandfather; Other in his father. Allergies  Allergen Reactions   Cephalexin Nausea Only    headache   Current Outpatient Medications on File Prior to Visit  Medication Sig Dispense Refill   diltiazem (CARDIZEM CD) 240 MG 24 hr capsule TAKE 1  CAPSULE(240 MG) BY MOUTH DAILY 90 capsule 0   HYDROcodone bit-homatropine (HYCODAN) 5-1.5 MG/5ML syrup Take 5 mLs by mouth every 8 (eight) hours as needed for cough. 120 mL 0   ibuprofen (ADVIL) 600 MG tablet Take 1 tablet (600 mg total) by mouth every 6 (six) hours as needed. 30 tablet 0   ketoconazole (NIZORAL) 2 % shampoo Apply 1 application topically 2 (two) times a week. Use for 3 weeks.  Leave shampoo on for 3-5 minutes before rinsing. 120 mL 0   lidocaine (LIDODERM) 5 % Place 1 patch onto the skin daily. Remove & Discard patch within 12 hours or as directed by MD 30 patch 0   ondansetron (ZOFRAN ODT) 4 MG disintegrating tablet Take 1 tablet (4 mg total) by mouth every 8 (eight) hours as needed for nausea or vomiting. 12 tablet 0   valACYclovir (VALTREX) 500 MG tablet Take one tablet BID for 3 days prn outbreak. 30 tablet 2   No current facility-administered medications on file prior to visit.        ROS:  All others reviewed and negative.  Objective        PE:  BP 126/72   Pulse 82   Temp 98.2 F (36.8 C) (Oral)   Ht 5' 5.75" (1.67 m)   Wt 203  lb (92.1 kg)   SpO2 97%   BMI 33.01 kg/m                 Constitutional: Pt appears in NAD, mild ill               HENT: Head: NCAT.                Right Ear: External ear normal.                 Left Ear: External ear normal. Bilat tm's with mild erythema.  Max sinus areas mild tender.  Pharynx with mild erythema, no exudate                Eyes: . Pupils are equal, round, and reactive to light. Conjunctivae and EOM are normal               Nose: without d/c or deformity               Neck: Neck supple. Gross normal ROM               Cardiovascular: Normal rate and regular rhythm.                 Pulmonary/Chest: Effort normal and breath sounds without rales or wheezing.                               Neurological: Pt is alert. At baseline orientation, motor grossly intact               Skin: Skin is warm. No rashes, no other new  lesions, LE edema - none               Psychiatric: Pt behavior is normal without agitation   Micro: none  Cardiac tracings I have personally interpreted today:  none  Pertinent Radiological findings (summarize): none   Lab Results  Component Value Date   WBC 5.0 10/21/2021   HGB 14.0 10/21/2021   HCT 41.8 10/21/2021   PLT 291.0 10/21/2021   GLUCOSE 96 10/21/2021   CHOL 186 10/21/2021   TRIG 83.0 10/21/2021   HDL 45.40 10/21/2021   LDLCALC 124 (H) 10/21/2021   ALT 16 10/21/2021   AST 17 10/21/2021   NA 136 10/21/2021   K 4.1 10/21/2021   CL 102 10/21/2021   CREATININE 0.99 10/21/2021   BUN 10 10/21/2021   CO2 27 10/21/2021   TSH 1.62 10/21/2021   HGBA1C 5.6 10/21/2021   Assessment/Plan:  Edwin Garcia is a 42 y.o. Black or African American [2] male with  has a past medical history of Allergy, APHTHOUS ULCERS (05/30/2009), Bulimia, CHALAZION, LEFT (05/30/2009), DEPRESSION (11/01/2008), Panic attack, and Syphilis.  Acute upper respiratory infection Mild to mod, for antibx course zpack, cough med prn,,  to f/u any worsening symptoms or concerns  Essential hypertension BP Readings from Last 3 Encounters:  04/15/22 126/72  04/07/22 (!) 140/90  01/26/22 130/80   Stable, pt to continue medical treatment card CD 240 qd  Followup: Return if symptoms worsen or fail to improve.  Cathlean Cower, MD 04/19/2022 1:04 PM [facility]

## 2022-04-15 NOTE — Patient Instructions (Signed)
Please take all new medication as prescribed - the antibiotic, and cough medicine  You are given the work note today  Please continue all other medications as before, and refills have been done if requested.  Please have the pharmacy call with any other refills you may need.  Please keep your appointments with your specialists as you may have planned

## 2022-04-17 ENCOUNTER — Encounter: Payer: Self-pay | Admitting: Internal Medicine

## 2022-04-17 NOTE — Assessment & Plan Note (Signed)
BP Readings from Last 3 Encounters:  04/15/22 126/72  04/07/22 (!) 140/90  01/26/22 130/80   Stable, pt to continue medical treatment card CD 240 qd

## 2022-04-17 NOTE — Assessment & Plan Note (Signed)
Mild to mod, for antibx course zpack, cough med prn,,  to f/u any worsening symptoms or concerns  

## 2022-04-19 ENCOUNTER — Other Ambulatory Visit: Payer: Self-pay | Admitting: Radiology

## 2022-04-19 LAB — POC INFLUENZA A&B (BINAX/QUICKVUE)
Influenza A, POC: NEGATIVE
Influenza B, POC: NEGATIVE

## 2022-04-19 LAB — POCT RESPIRATORY SYNCYTIAL VIRUS: RSV Rapid Ag: NEGATIVE

## 2022-04-19 LAB — POC SOFIA SARS ANTIGEN FIA: SARS Coronavirus 2 Ag: NEGATIVE

## 2022-06-14 ENCOUNTER — Other Ambulatory Visit: Payer: Self-pay | Admitting: Family Medicine

## 2022-11-23 ENCOUNTER — Encounter: Payer: Self-pay | Admitting: Family Medicine

## 2022-11-23 ENCOUNTER — Ambulatory Visit: Payer: 59 | Admitting: Family Medicine

## 2022-11-23 VITALS — BP 124/80 | HR 80 | Temp 97.9°F | Ht 65.75 in | Wt 191.7 lb

## 2022-11-23 DIAGNOSIS — Z23 Encounter for immunization: Secondary | ICD-10-CM

## 2022-11-23 DIAGNOSIS — M722 Plantar fascial fibromatosis: Secondary | ICD-10-CM

## 2022-11-23 NOTE — Progress Notes (Signed)
Established Patient Office Visit  Subjective   Patient ID: Edwin Garcia, male    DOB: 05-04-1980  Age: 42 y.o. MRN: [id number]  Chief Complaint  Patient presents with   Foot Pain    Patient complains of left foot pain, x1 week     HPI   Edwin Garcia seen with onset this past week of left heel pain.  Denies any specific injury.  He did wear some platform type shoes over a week ago and wonders if that may have contributed.  Pain is heel region.  No recent change of shoewear.  He is on his feet much of the time.  He works 3 separate jobs and is on hard floor surfaces frequently.  No Achilles tenderness.  No past history of plantar fasciitis.  Went on [place] last summer and has lost about 30 pounds total.  Feels much better overall since the weight loss.  Past Medical History:  Diagnosis Date   Allergy    APHTHOUS ULCERS 05/30/2009   Bulimia    younger adult   CHALAZION, LEFT 05/30/2009   DEPRESSION 11/01/2008   Panic attack    Syphilis    Past Surgical History:  Procedure Laterality Date   ANKLE SURGERY  1999   RIGHT   TONSILLECTOMY     REMOVED AT 42 YRS OLD    reports that he has never smoked. He has never used smokeless tobacco. He reports current alcohol use. He reports that he does not use drugs. family history includes Cirrhosis in his paternal grandfather; Diabetes in his maternal grandmother; Diabetes (age of onset: 76) in his maternal grandfather; Healthy in his mother; Hyperlipidemia in his mother; Hypertension in his paternal grandmother; Hypertension (age of onset: 46) in his maternal grandmother; Hypertension (age of onset: 40) in his maternal grandfather; Other in his father. Allergies  Allergen Reactions   Cephalexin Nausea Only    headache    Review of Systems  Constitutional:  Negative for fever.      Objective:     BP 124/80 (BP Location: Left Arm, Patient Position: Sitting, Cuff Size: Normal)   Pulse 80   Temp 97.9 F (36.6 C) (Oral)   Ht 5'  5.75" (1.67 m)   Wt 191 lb 11.2 oz (87 kg)   SpO2 98%   BMI 31.18 kg/m  BP Readings from Last 3 Encounters:  11/23/22 124/80  04/15/22 126/72  04/07/22 (!) 140/90   Wt Readings from Last 3 Encounters:  11/23/22 191 lb 11.2 oz (87 kg)  04/15/22 203 lb (92.1 kg)  04/07/22 207 lb 9.6 oz (94.2 kg)      Physical Exam Vitals reviewed.  Constitutional:      General: He is not in acute distress. Cardiovascular:     Rate and Rhythm: Normal rate and regular rhythm.  Musculoskeletal:     Comments: Left foot reveals fairly high arches.  No visible swelling.  He has tenderness near the attachment of the plantar fascia to the calcaneus region.  No Achilles tenderness.  No pain with dorsiflexion or plantarflexion.  Neurological:     Mental Status: He is alert.      No results found for any visits on 11/23/22.    The 10-year ASCVD risk score (Arnett DK, et al., 2019) is: 4.9%    Assessment & Plan:   Left plantar fasciitis.  We have recommended consideration for shoe inserts especially because of his high arches, regular icing a few times daily, stretches with handout given -We  explained this may take several weeks or even months to fully resolve. -If pain not improving with above consider either podiatry referral or possible steroid injection  Evelena Peat, MD

## 2022-11-23 NOTE — Patient Instructions (Signed)
Stretch frequently , as discussed  Ice regularly  Consider shoe inserts.

## 2022-12-08 ENCOUNTER — Encounter: Payer: Self-pay | Admitting: Family Medicine

## 2022-12-08 ENCOUNTER — Ambulatory Visit: Payer: 59 | Admitting: Family Medicine

## 2022-12-08 VITALS — BP 116/70 | HR 70 | Temp 97.6°F | Ht 65.35 in | Wt 187.1 lb

## 2022-12-08 DIAGNOSIS — Z0000 Encounter for general adult medical examination without abnormal findings: Secondary | ICD-10-CM | POA: Diagnosis not present

## 2022-12-08 DIAGNOSIS — E7800 Pure hypercholesterolemia, unspecified: Secondary | ICD-10-CM

## 2022-12-08 DIAGNOSIS — Z23 Encounter for immunization: Secondary | ICD-10-CM | POA: Diagnosis not present

## 2022-12-08 LAB — CBC WITH DIFFERENTIAL/PLATELET
Basophils Absolute: 0 10*3/uL (ref 0.0–0.1)
Basophils Relative: 0.4 % (ref 0.0–3.0)
Eosinophils Absolute: 0.1 10*3/uL (ref 0.0–0.7)
Eosinophils Relative: 1 % (ref 0.0–5.0)
HCT: 44 % (ref 39.0–52.0)
Hemoglobin: 14.6 g/dL (ref 13.0–17.0)
Lymphocytes Relative: 32 % (ref 12.0–46.0)
Lymphs Abs: 1.7 10*3/uL (ref 0.7–4.0)
MCHC: 33.1 g/dL (ref 30.0–36.0)
MCV: 88.5 fL (ref 78.0–100.0)
Monocytes Absolute: 0.6 10*3/uL (ref 0.1–1.0)
Monocytes Relative: 10.9 % (ref 3.0–12.0)
Neutro Abs: 2.9 10*3/uL (ref 1.4–7.7)
Neutrophils Relative %: 55.7 % (ref 43.0–77.0)
Platelets: 347 10*3/uL (ref 150.0–400.0)
RBC: 4.97 Mil/uL (ref 4.22–5.81)
RDW: 14.4 % (ref 11.5–15.5)
WBC: 5.2 10*3/uL (ref 4.0–10.5)

## 2022-12-08 LAB — BASIC METABOLIC PANEL
BUN: 8 mg/dL (ref 6–23)
CO2: 29 meq/L (ref 19–32)
Calcium: 9.7 mg/dL (ref 8.4–10.5)
Chloride: 99 meq/L (ref 96–112)
Creatinine, Ser: 0.95 mg/dL (ref 0.40–1.50)
GFR: 100.42 mL/min (ref >60.00)
Glucose, Bld: 90 mg/dL (ref 70–99)
Potassium: 4.6 meq/L (ref 3.5–5.1)
Sodium: 135 meq/L (ref 135–145)

## 2022-12-08 LAB — HEPATIC FUNCTION PANEL
ALT: 13 U/L (ref 0–53)
AST: 13 U/L (ref 0–37)
Albumin: 4.5 g/dL (ref 3.5–5.2)
Alkaline Phosphatase: 100 U/L (ref 39–117)
Bilirubin, Direct: 0.2 mg/dL (ref 0.0–0.3)
Total Bilirubin: 0.9 mg/dL (ref 0.2–1.2)
Total Protein: 7.4 g/dL (ref 6.0–8.3)

## 2022-12-08 LAB — PSA: PSA: 1.26 ng/mL (ref 0.10–4.00)

## 2022-12-08 LAB — LIPID PANEL
Cholesterol: 220 mg/dL — ABNORMAL HIGH (ref 0–200)
HDL: 46.3 mg/dL (ref >39.00)
LDL Cholesterol: 157 mg/dL — ABNORMAL HIGH (ref 0–99)
NonHDL: 173.62
Total CHOL/HDL Ratio: 5
Triglycerides: 81 mg/dL (ref 0.0–149.0)
VLDL: 16.2 mg/dL (ref 0.0–40.0)

## 2022-12-08 NOTE — Progress Notes (Signed)
Established Patient Office Visit  Subjective   Patient ID: Edwin Garcia, male    DOB: [birth year]  Age: 42 y.o. MRN: [id number]  Chief Complaint  Patient presents with   Annual Exam    HPI   Edwin Garcia is seen for physical exam.  He has past medical history significant for hypertension, chronic left bundle branch block, history of depression, and history of obesity.  Currently followed by [facility] and treated with [facility] 1.7 mg subcutaneous once weekly.  Weight is down about 25 pounds from last year at this time.  He feels much better overall.  Blood pressure also has been much improved.  He remains on diltiazem CD 240 mg once daily.  Compliant with medication.  No recent dizziness.  Health maintenance reviewed  Health Maintenance  Topic Date Due   COVID-19 Vaccine (3 - 2023-24 season) 09/19/2022   DTaP/Tdap/Td (4 - Td or Tdap) 12/07/2032   INFLUENZA VACCINE  Completed   Hepatitis C Screening  Completed   HIV Screening  Completed   HPV VACCINES  Aged Out   -Consents to tetanus vaccine today  Social history-he has had the same partner now for about 3 years.  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 and with city of [place].  Non-smoker.  Frequently drinks tequila most days.   Family history-significant for type 2 diabetes and hypertension maternal grandmother.  Maternal grandfather also had diabetes.  Hypertension in parents and grandparents   Past Medical History:  Diagnosis Date   Allergy    APHTHOUS ULCERS 05/30/2009   Bulimia    younger adult   CHALAZION, LEFT 05/30/2009   DEPRESSION 11/01/2008   Panic attack    Syphilis    Past Surgical History:  Procedure Laterality Date   ANKLE SURGERY  1999   RIGHT   TONSILLECTOMY     REMOVED AT 42 YRS OLD    reports that he has never smoked. He has never used smokeless tobacco. He reports current alcohol use. He reports that he does not use drugs. family history includes  Cirrhosis in his paternal grandfather; Diabetes in his maternal grandmother; Diabetes (age of onset: 31) in his maternal grandfather; Healthy in his mother; Hyperlipidemia in his mother; Hypertension in his paternal grandmother; Hypertension (age of onset: 11) in his maternal grandmother; Hypertension (age of onset: 20) in his maternal grandfather; Other in his father. Allergies  Allergen Reactions   Cephalexin Nausea Only    headache     Review of Systems  Constitutional:  Negative for chills, fever, malaise/fatigue and weight loss.  HENT:  Negative for hearing loss.   Eyes:  Negative for blurred vision and double vision.  Respiratory:  Negative for cough and shortness of breath.   Cardiovascular:  Negative for chest pain, palpitations and leg swelling.  Gastrointestinal:  Negative for abdominal pain, blood in stool, constipation and diarrhea.  Genitourinary:  Negative for dysuria.  Skin:  Negative for rash.  Neurological:  Negative for dizziness, speech change, seizures, loss of consciousness and headaches.  Psychiatric/Behavioral:  Negative for depression.       Objective:     BP 116/70 (BP Location: Left Arm, Patient Position: Sitting, Cuff Size: Normal)   Pulse 70   Temp 97.6 F (36.4 C) (Oral)   Ht 5' 5.35" (1.66 m)   Wt 187 lb 1.6 oz (84.9 kg)   SpO2 98%   BMI 30.80 kg/m    Physical Exam Vitals reviewed.  Constitutional:      General: He is not in acute distress.    Appearance: He is well-developed.  HENT:     Head: Normocephalic and atraumatic.     Right Ear: External ear normal.     Left Ear: External ear normal.  Eyes:     Conjunctiva/sclera: Conjunctivae normal.     Pupils: Pupils are equal, round, and reactive to light.  Neck:     Thyroid: No thyromegaly.  Cardiovascular:     Rate and Rhythm: Normal rate and regular rhythm.     Heart sounds: Normal heart sounds. No murmur heard. Pulmonary:     Effort: No respiratory distress.     Breath sounds: No  wheezing or rales.  Abdominal:     General: Bowel sounds are normal. There is no distension.     Palpations: Abdomen is soft. There is no mass.     Tenderness: There is no abdominal tenderness. There is no guarding or rebound.  Musculoskeletal:     Cervical back: Normal range of motion and neck supple.     Right lower leg: No edema.     Left lower leg: No edema.  Lymphadenopathy:     Cervical: No cervical adenopathy.  Skin:    Findings: No rash.  Neurological:     Mental Status: He is alert and oriented to person, place, and time.     Cranial Nerves: No cranial nerve deficit.      No results found for any visits on 12/08/22.    The 10-year ASCVD risk score (Arnett DK, et al., 2019) is: 4.3%    Assessment & Plan:   Problem List Items Addressed This Visit   None Visit Diagnoses     Physical exam    -  Primary   Relevant Orders   Lipid panel   Basic metabolic panel   CBC with Differential/Platelet   Hepatic function panel   PSA   Need for tetanus booster       Relevant Orders   Tdap vaccine greater than or equal to 7yo IM (Completed)     Edwin Garcia has hypertension which is well-controlled and especially better controlled since losing the weight as above.  He plans to continue with the [facility] per [facility] and hopefully taper down at some point soon.  We discussed the following health maintenance items  -Due for tetanus and he consents today to Tdap -Flu vaccine already given -Discussed getting baseline PSA and he consents -Discussed importance of regular exercise at least 150 minutes/week moderate intensity -We did discuss the fact that we may need to back down his diltiazem at some point if he continues to lose weight  No follow-ups on file.    Evelena Peat, MD

## 2022-12-10 NOTE — Addendum Note (Signed)
Addended by: Christy Sartorius on: 12/10/2022 08:26 AM   Modules accepted: Orders

## 2022-12-17 ENCOUNTER — Other Ambulatory Visit: Payer: Self-pay | Admitting: Family Medicine

## 2022-12-21 NOTE — Telephone Encounter (Signed)
Patient informed rx was sent on 12/20/2022

## 2022-12-21 NOTE — Telephone Encounter (Signed)
Pt called to F/U on this refill request, stating Pharmacy is telling him they are waiting on MD.

## 2023-03-07 ENCOUNTER — Ambulatory Visit: Payer: Self-pay | Admitting: Family Medicine

## 2023-03-07 NOTE — Telephone Encounter (Signed)
Copied from CRM [id number]. Topic: Clinical - Red Word Triage >> Mar 07, 2023 11:23 AM Prudencio Pair wrote: Red Word that prompted transfer to Nurse Triage: Patient states he is concerned about his kidneys & wants to see his pcp today if possible. States his back is very itchy at times & his back is also breaking out in sores.   Chief Complaint: Skin breaking out on back, 3 sores, 1 open Symptoms: no pain, multiple breakouts on back Frequency: constant Pertinent Negatives: Patient denies fever, joint pain,  Disposition: [] ED /[] Urgent Care (no appt availability in office) / [x] Appointment(In office/virtual)/ []  [facility]/ [] Home Care/ [] Refused Recommended Disposition /[] [facility]/ []  Follow-up with PCP Additional Notes: Pt reports history of "bad skin" states that he breaks out frequently. Pt now reporting multiple blackheads on his back and and 3 areas that are coming to a head. He states the areas are the size of a quarter. 1 has bursted open. RN was schedulling an appt with Dr. Caryl Never when call was disconnected. LVM on pts phoen to call back to continue scheduling. Will provide care advice if/when he calls back.    Reason for Disposition  2 or more boils  Answer Assessment - Initial Assessment Questions 1. APPEARANCE of BOIL: "What does the boil look like?"      Looks like blackheads and a few are coming to a head. Has an open sore  2. LOCATION: "Where is the boil located?"      Located on back, upper and middle  3. NUMBER: "How many boils are there?"      3 are coming to a head, 1 has busted already  4. SIZE: "How big is the boil?" (e.g., inches, cm; compare to size of a coin or other object)     Between a nickel and quarter  5. ONSET: "When did the boil start?"     Started 6 months ago in general, started coming to a head 2-3 months ago  6. PAIN: "Is there any pain?" If Yes, ask: "How bad is the pain?"   (Scale 1-10; or mild, moderate, severe)      No  7. FEVER: "Do you have a fever?" If Yes, ask: "What is it, how was it measured, and when did it start?"      No  8. SOURCE: "Have you been around anyone with boils or other Staph infections?" "Have you ever had boils before?"     Unsure, has had boils before, has had "bad" skin, but its happening more frequently  9. OTHER SYMPTOMS: "Do you have any other symptoms?" (e.g., shaking chills, weakness, rash elsewhere on body)     No  10. PREGNANCY: "Is there any chance you are pregnant?" "When was your last menstrual period?"       N/a  Protocols used: Boil (Skin Abscess)-A-AH

## 2023-03-07 NOTE — Telephone Encounter (Signed)
Left message for patient to contact the office to schedule an appointment.

## 2023-03-11 ENCOUNTER — Ambulatory Visit: Payer: Self-pay | Admitting: Family Medicine

## 2023-03-11 NOTE — Telephone Encounter (Signed)
This RN second attempt to contact patient for triage. No answer, voicemail left requesting return call to clinic.

## 2023-03-11 NOTE — Telephone Encounter (Signed)
  Chief Complaint: Wants to decrease bp medication Diltiazem Symptoms: NA Frequency: states lost weight and was instructed to call back. Pertinent Negatives: Patient denies NA Disposition: [] ED /[] Urgent Care (no appt availability in office) / [] Appointment(In office/virtual)/ []  [facility]/ [] Home Care/ [] Refused Recommended Disposition /[]  Mobile Bus/ [x]  Follow-up with PCP Additional Notes: Patient was instructed to call Dr. To let him know if he wanted to decrease bp medications due to weight loss.  Would like a call back from PCP office.  Reason for Disposition  [1] Caller has NON-URGENT medicine question about med that PCP prescribed AND [2] triager unable to answer question  Answer Assessment - Initial Assessment Questions 1. NAME of MEDICINE: "What medicine(s) are you calling about?"     Diltiazem  2. QUESTION: "What is your question?" (e.g., double dose of medicine, side effect)     Want's to decrease medication. 3. PRESCRIBER: "Who prescribed the medicine?" Reason: if prescribed by specialist, call should be referred to that group.     Dr. Caryl Never 4. SYMPTOMS: "Do you have any symptoms?" If Yes, ask: "What symptoms are you having?"  "How bad are the symptoms (e.g., mild, moderate, severe)     Lost weight and was told to call the dr. To let him know that higher dose may not be necessary.  5. PREGNANCY:  "Is there any chance that you are pregnant?" "When was your last menstrual period?"     na  Protocols used: Medication Question Call-A-AH

## 2023-03-11 NOTE — Telephone Encounter (Signed)
First attempt, left VM, no answer.  Copied from CRM [id number]. Topic: Clinical - Medication Question >> Mar 11, 2023  8:31 AM Edwin Garcia wrote: Reason for CRM: Patient would like to decrease the dosage on his medication for blood pressure.    diltiazem (CARDIZEM CD) 240 MG 24 hr capsule

## 2023-03-14 NOTE — Telephone Encounter (Signed)
 Patient has been scheduled for appt to discuss

## 2023-03-16 ENCOUNTER — Encounter: Payer: Self-pay | Admitting: Family Medicine

## 2023-03-16 ENCOUNTER — Ambulatory Visit: Payer: 59 | Admitting: Family Medicine

## 2023-03-16 VITALS — BP 118/82 | HR 75 | Temp 97.6°F | Wt 188.1 lb

## 2023-03-16 DIAGNOSIS — I10 Essential (primary) hypertension: Secondary | ICD-10-CM

## 2023-03-16 DIAGNOSIS — E7800 Pure hypercholesterolemia, unspecified: Secondary | ICD-10-CM

## 2023-03-16 LAB — BASIC METABOLIC PANEL
BUN: 7 mg/dL (ref 6–23)
CO2: 29 meq/L (ref 19–32)
Calcium: 9.3 mg/dL (ref 8.4–10.5)
Chloride: 102 meq/L (ref 96–112)
Creatinine, Ser: 0.99 mg/dL (ref 0.40–1.50)
GFR: 95.39 mL/min (ref >60.00)
Glucose, Bld: 93 mg/dL (ref 70–99)
Potassium: 4.1 meq/L (ref 3.5–5.1)
Sodium: 138 meq/L (ref 135–145)

## 2023-03-16 LAB — LIPID PANEL
Cholesterol: 184 mg/dL (ref 0–200)
HDL: 48.2 mg/dL (ref >39.00)
LDL Cholesterol: 122 mg/dL — ABNORMAL HIGH (ref 0–99)
NonHDL: 135.95
Total CHOL/HDL Ratio: 4
Triglycerides: 68 mg/dL (ref 0.0–149.0)
VLDL: 13.6 mg/dL (ref 0.0–40.0)

## 2023-03-16 MED ORDER — DILTIAZEM HCL ER COATED BEADS 120 MG PO CP24: 120.0000 mg | ORAL_CAPSULE | Freq: Every day | ORAL | 3 refills | Status: DC

## 2023-03-16 NOTE — Progress Notes (Signed)
 Established Patient Office Visit  Subjective   Patient ID: Edwin Garcia, male    DOB: 06/04/1980  Age: 43 y.o. MRN: [id number]  Chief Complaint  Patient presents with   Labs Only    HPI   Edwin Garcia is seen to discuss blood pressure issues.  He currently takes Cardizem CD 240 mg daily.  He has lost some weight during the past year and made dietary changes and has found that his blood pressure is too low at times.  He has lightheadedness frequently with standing.  He had some leftover 120 mg tablets and noticed less dizziness when taking that.  He is basically requesting going back on lower dose.  His blood pressures have consistently been well-controlled at 240 mg dose.  He is also requesting kidney profile.  Has not had any recent edema.  He had atypical type rash on his back recently and had read this could be associated with kidney failure.  He again has had no decrease in urine output or peripheral edema issues.  Past Medical History:  Diagnosis Date   Allergy    APHTHOUS ULCERS 05/30/2009   Bulimia    younger adult   CHALAZION, LEFT 05/30/2009   DEPRESSION 11/01/2008   Panic attack    Syphilis    Past Surgical History:  Procedure Laterality Date   ANKLE SURGERY  1999   RIGHT   TONSILLECTOMY     REMOVED AT 43 YRS OLD    reports that he has never smoked. He has never used smokeless tobacco. He reports current alcohol use. He reports that he does not use drugs. family history includes Cirrhosis in his paternal grandfather; Diabetes in his maternal grandmother; Diabetes (age of onset: 54) in his maternal grandfather; Healthy in his mother; Hyperlipidemia in his mother; Hypertension in his paternal grandmother; Hypertension (age of onset: 67) in his maternal grandmother; Hypertension (age of onset: 59) in his maternal grandfather; Other in his father. Allergies  Allergen Reactions   Cephalexin Nausea Only    headache    Review of Systems  Constitutional:  Negative for  malaise/fatigue.  Eyes:  Negative for blurred vision.  Respiratory:  Negative for shortness of breath.   Cardiovascular:  Negative for chest pain.  Neurological:  Positive for dizziness. Negative for weakness and headaches.      Objective:     BP 118/82 (BP Location: Left Arm, Patient Position: Sitting, Cuff Size: Normal)   Pulse 75   Temp 97.6 F (36.4 C) (Oral)   Wt 188 lb 1.6 oz (85.3 kg)   SpO2 98%   BMI 30.96 kg/m  BP Readings from Last 3 Encounters:  03/16/23 118/82  12/08/22 116/70  11/23/22 124/80   Wt Readings from Last 3 Encounters:  03/16/23 188 lb 1.6 oz (85.3 kg)  12/08/22 187 lb 1.6 oz (84.9 kg)  11/23/22 191 lb 11.2 oz (87 kg)      Physical Exam Vitals reviewed.  Constitutional:      Appearance: He is well-developed.  Eyes:     Pupils: Pupils are equal, round, and reactive to light.  Neck:     Thyroid: No thyromegaly.  Cardiovascular:     Rate and Rhythm: Normal rate and regular rhythm.  Pulmonary:     Effort: Pulmonary effort is normal. No respiratory distress.     Breath sounds: Normal breath sounds. No wheezing or rales.  Neurological:     Mental Status: He is alert and oriented to person, place, and time.  No results found for any visits on 03/16/23.    The 10-year ASCVD risk score (Arnett DK, et al., 2019) is: 4.7%    Assessment & Plan:   Hypertension currently well-controlled on Cardizem CD 240 mg daily.  Patient has lost some weight during the past year and has had frequent lightheaded symptoms since then.  Will try reducing Cardizem CD back to 120 mg and continue close home monitoring.  Be in touch if systolics over 140 consistently or diastolics over 90.  Be in touch if dizziness persists after reducing Cardizem dosage  Patient requesting repeat renal profile.  Will obtain basic metabolic panel   No follow-ups on file.    Evelena Peat, MD

## 2023-04-08 ENCOUNTER — Ambulatory Visit: Payer: Self-pay | Admitting: Family Medicine

## 2023-04-08 ENCOUNTER — Ambulatory Visit: Admitting: Family

## 2023-04-08 ENCOUNTER — Encounter: Payer: Self-pay | Admitting: Family

## 2023-04-08 VITALS — BP 122/70 | HR 84 | Temp 97.9°F | Ht 65.35 in | Wt 183.0 lb

## 2023-04-08 DIAGNOSIS — R112 Nausea with vomiting, unspecified: Secondary | ICD-10-CM | POA: Diagnosis not present

## 2023-04-08 DIAGNOSIS — R197 Diarrhea, unspecified: Secondary | ICD-10-CM | POA: Diagnosis not present

## 2023-04-08 DIAGNOSIS — A084 Viral intestinal infection, unspecified: Secondary | ICD-10-CM | POA: Diagnosis not present

## 2023-04-08 MED ORDER — ONDANSETRON HCL 4 MG PO TABS: 4.0000 mg | ORAL_TABLET | Freq: Three times a day (TID) | ORAL | 0 refills | Status: DC | PRN

## 2023-04-08 NOTE — Telephone Encounter (Signed)
 Vomiting (x2) started this morning Symptoms: Nauseous, headache, diarrhea (x2) Frequency: Started this morning Pertinent Negatives: Patient denies abdomen pain  Disposition: [x] Appointment(In office)  Additional Notes: Pt states he woke up today with vomiting and diarrhea. Pt states he was around someone yesterday who has the norovirus. Pt is not sure if he has a fever but states his "head feels warm." Pt scheduled for an appt today at another office as no availability at PCP office. This RN educated pt on home care, new-worsening symptoms, when to call back/seek emergent care. Pt verbalized understanding and agrees to plan.    Copied from CRM [id number]. Topic: Clinical - Red Word Triage >> Apr 08, 2023  9:47 AM Denese Killings wrote: Red Word that prompted transfer to Nurse Triage: Patient think he has a viral infection. Symptoms- nausea, stomach aches, diarrhea, and fatigue. Reason for Disposition  [1] Vomiting AND [2] abdomen looks much more swollen than usual  Answer Assessment - Initial Assessment Questions Vomiting x2 and diarrhea x2 started this morning Nauseous and headache Pt drinking water Denies abdomen pain Fiance also sick with vomiting and diarrhea Not sure if has fever "head feels warm"  Protocols used: Vomiting-A-AH

## 2023-04-08 NOTE — Progress Notes (Signed)
 Acute Office Visit  Subjective:     Patient ID: Edwin Garcia, male    DOB: [birth year], 43 y.o.   MRN: [id number]  Chief Complaint  Patient presents with  . Emesis    Nausea and headache all day, started today. Threw up x2   . Diarrhea    HPI Patient is in today with complaints of nausea, vomiting, diarrhea, body aches that began this morning.  Neysa Bonito is also sick with similar symptoms.  Reports coming in contact with norovirus.  Has not taken any medications for relief.  Admits to eating [employer] tuna sub last night for dinner.  Review of Systems  Constitutional:  Positive for chills and malaise/fatigue. Negative for fever.  Respiratory: Negative.    Cardiovascular: Negative.   Gastrointestinal:  Positive for diarrhea, nausea and vomiting. Negative for blood in stool.  Musculoskeletal:  Positive for myalgias.  Neurological: Negative.   Endo/Heme/Allergies: Negative.   Psychiatric/Behavioral: Negative.    All other systems reviewed and are negative. Past Medical History:  Diagnosis Date  . Allergy   . APHTHOUS ULCERS 05/30/2009  . Bulimia    younger adult  . CHALAZION, LEFT 05/30/2009  . DEPRESSION 11/01/2008  . Panic attack   . Syphilis     Social History   Socioeconomic History  . Marital status: Single    Spouse name: Not on file  . Number of children: Not on file  . Years of education: Not on file  . Highest education level: Not on file  Occupational History  . Occupation: SECURITY    Employer: [employer]  Tobacco Use  . Smoking status: Never  . Smokeless tobacco: Never  Vaping Use  . Vaping status: Former  Substance and Sexual Activity  . Alcohol use: Yes    Comment: 7  . Drug use: No  . Sexual activity: Yes    Birth control/protection: Condom    Comment: NUMBER OF SEX PARTNERS IN 27 MOS - 6, Homosexual male  Other Topics Concern  . Not on file  Social History Narrative   Lives alone. Monogomous partner for 1 year year in 11/2015.  Partner HIV positive. HD testing 2-3x a year.       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    Social Drivers of Corporate investment banker Strain: Not on file  Food Insecurity: Not on file  Transportation Needs: Not on file  Physical Activity: Not on file  Stress: Not on file  Social Connections: Not on file  Intimate Partner Violence: Not on file    Past Surgical History:  Procedure Laterality Date  . ANKLE SURGERY  1999   RIGHT  . TONSILLECTOMY     REMOVED AT 43 YRS OLD    Family History  Problem Relation Age of Onset  . Hyperlipidemia Mother   . Healthy Mother   . Other Father        smoker- lung nodule  . Hypertension Maternal Grandmother 58  . Diabetes Maternal Grandmother   . Diabetes Maternal Grandfather 70       TYPE II  . Hypertension Maternal Grandfather 45  . Hypertension Paternal Grandmother   . Cirrhosis Paternal Grandfather        cirrhosis alcohol related, also smoker- ? lung cancer  . Alcohol abuse Neg Hx        family  . Arthritis Neg Hx  family  . Cancer Neg Hx        family hx - breast ,lung,colon,prostate ca  . Depression Neg Hx        family    Allergies  Allergen Reactions  . Cephalexin Nausea Only    headache    Current Outpatient Medications on File Prior to Visit  Medication Sig Dispense Refill  . diltiazem (CARDIZEM CD) 120 MG 24 hr capsule Take 1 capsule (120 mg total) by mouth daily. 90 capsule 3  . ibuprofen (ADVIL) 600 MG tablet Take 1 tablet (600 mg total) by mouth every 6 (six) hours as needed. 30 tablet 0  . ketoconazole (NIZORAL) 2 % shampoo Apply 1 application topically 2 (two) times a week. Use for 3 weeks.  Leave shampoo on for 3-5 minutes before rinsing. 120 mL 0  . lidocaine (LIDODERM) 5 % Place 1 patch onto the skin daily. Remove & Discard patch within 12 hours or as directed by MD 30 patch 0  . ondansetron (ZOFRAN ODT) 4 MG  disintegrating tablet Take 1 tablet (4 mg total) by mouth every 8 (eight) hours as needed for nausea or vomiting. 12 tablet 0  . valACYclovir (VALTREX) 500 MG tablet Take one tablet BID for 3 days prn outbreak. 30 tablet 2   No current facility-administered medications on file prior to visit.    BP 122/70 (BP Location: Left Arm, Patient Position: Sitting)   Pulse 84   Temp 97.9 F (36.6 C) (Temporal)   Ht 5' 5.35" (1.66 m)   Wt 183 lb (83 kg)   SpO2 99%   BMI 30.13 kg/m chart      Objective:    BP 122/70 (BP Location: Left Arm, Patient Position: Sitting)   Pulse 84   Temp 97.9 F (36.6 C) (Temporal)   Ht 5' 5.35" (1.66 m)   Wt 183 lb (83 kg)   SpO2 99%   BMI 30.13 kg/m    Physical Exam Vitals and nursing note reviewed.  Constitutional:      Appearance: Normal appearance. He is normal weight.  HENT:     Mouth/Throat:     Mouth: Mucous membranes are moist.  Cardiovascular:     Rate and Rhythm: Normal rate and regular rhythm.  Pulmonary:     Effort: Pulmonary effort is normal.     Breath sounds: Normal breath sounds.  Abdominal:     General: Abdomen is flat. Bowel sounds are normal. There is no distension.     Palpations: Abdomen is soft.     Tenderness: There is no abdominal tenderness. There is no guarding.  Musculoskeletal:        General: Normal range of motion.     Cervical back: Normal range of motion and neck supple.  Skin:    General: Skin is warm and dry.  Neurological:     General: No focal deficit present.     Mental Status: He is alert and oriented to person, place, and time. Mental status is at baseline.  Psychiatric:        Mood and Affect: Mood normal.        Behavior: Behavior normal.   No results found for any visits on 04/08/23.      Assessment & Plan:   Problem List Items Addressed This Visit   None Visit Diagnoses       Viral gastroenteritis    -  Primary     Nausea and vomiting, unspecified vomiting type  Diarrhea,  unspecified type           Meds ordered this encounter  Medications  . ondansetron (ZOFRAN) 4 MG tablet    Sig: Take 1 tablet (4 mg total) by mouth every 8 (eight) hours as needed for nausea or vomiting.    Dispense:  20 tablet    Refill:  0   Rest.  Drink plenty of fluids.  Bland diet advance as able as tolerated.  Call the office if symptoms worsen or persist.  Recheck as scheduled and sooner as needed. No follow-ups on file.  Eulis Foster, FNP

## 2023-06-20 ENCOUNTER — Ambulatory Visit: Admitting: Family Medicine

## 2023-07-15 ENCOUNTER — Encounter (HOSPITAL_BASED_OUTPATIENT_CLINIC_OR_DEPARTMENT_OTHER): Payer: Self-pay

## 2023-07-15 ENCOUNTER — Other Ambulatory Visit: Payer: Self-pay

## 2023-07-15 ENCOUNTER — Emergency Department (HOSPITAL_BASED_OUTPATIENT_CLINIC_OR_DEPARTMENT_OTHER)
Admission: EM | Admit: 2023-07-15 | Discharge: 2023-07-16 | Disposition: A | Discharge status: Home / Self Care | Attending: Emergency Medicine | Admitting: Emergency Medicine

## 2023-07-15 ENCOUNTER — Emergency Department (HOSPITAL_BASED_OUTPATIENT_CLINIC_OR_DEPARTMENT_OTHER)

## 2023-07-15 DIAGNOSIS — R4789 Other speech disturbances: Secondary | ICD-10-CM | POA: Insufficient documentation

## 2023-07-15 DIAGNOSIS — R200 Anesthesia of skin: Secondary | ICD-10-CM | POA: Diagnosis not present

## 2023-07-15 DIAGNOSIS — R4701 Aphasia: Secondary | ICD-10-CM | POA: Diagnosis present

## 2023-07-15 LAB — DIFFERENTIAL
Abs Immature Granulocytes: 0.02 10*3/uL (ref 0.00–0.07)
Basophils Absolute: 0 10*3/uL (ref 0.0–0.1)
Basophils Relative: 0 %
Eosinophils Absolute: 0.1 10*3/uL (ref 0.0–0.5)
Eosinophils Relative: 1 %
Immature Granulocytes: 0 %
Lymphocytes Relative: 24 %
Lymphs Abs: 1.7 10*3/uL (ref 0.7–4.0)
Monocytes Absolute: 0.6 10*3/uL (ref 0.1–1.0)
Monocytes Relative: 8 %
Neutro Abs: 4.7 10*3/uL (ref 1.7–7.7)
Neutrophils Relative %: 67 %

## 2023-07-15 LAB — COMPREHENSIVE METABOLIC PANEL WITH GFR
ALT: 11 U/L (ref 0–44)
AST: 17 U/L (ref 15–41)
Albumin: 4.5 g/dL (ref 3.5–5.0)
Alkaline Phosphatase: 99 U/L (ref 38–126)
Anion gap: 14 (ref 5–15)
BUN: 8 mg/dL (ref 6–20)
CO2: 23 mmol/L (ref 22–32)
Calcium: 9.3 mg/dL (ref 8.9–10.3)
Chloride: 102 mmol/L (ref 98–111)
Creatinine, Ser: 0.96 mg/dL (ref 0.61–1.24)
GFR, Estimated: >60 mL/min (ref >60)
Glucose, Bld: 108 mg/dL — ABNORMAL HIGH (ref 70–99)
Potassium: 3.8 mmol/L (ref 3.5–5.1)
Sodium: 139 mmol/L (ref 135–145)
Total Bilirubin: 0.5 mg/dL (ref 0.0–1.2)
Total Protein: 7.7 g/dL (ref 6.5–8.1)

## 2023-07-15 LAB — PROTIME-INR
INR: 0.9 (ref 0.8–1.2)
Prothrombin Time: 12.9 s (ref 11.4–15.2)

## 2023-07-15 LAB — CBG MONITORING, ED: Glucose-Capillary: 107 mg/dL — ABNORMAL HIGH (ref 70–99)

## 2023-07-15 LAB — CBC
HCT: 42.7 % (ref 39.0–52.0)
Hemoglobin: 14.9 g/dL (ref 13.0–17.0)
MCH: 29.4 pg (ref 26.0–34.0)
MCHC: 34.9 g/dL (ref 30.0–36.0)
MCV: 84.4 fL (ref 80.0–100.0)
Platelets: 331 10*3/uL (ref 150–400)
RBC: 5.06 MIL/uL (ref 4.22–5.81)
RDW: 13.8 % (ref 11.5–15.5)
WBC: 7.1 10*3/uL (ref 4.0–10.5)
nRBC: 0 % (ref 0.0–0.2)

## 2023-07-15 LAB — APTT: aPTT: 31 s (ref 24–36)

## 2023-07-15 LAB — ETHANOL: Alcohol, Ethyl (B): <15 mg/dL (ref <15)

## 2023-07-15 MED ORDER — SODIUM CHLORIDE 0.9% FLUSH
3.0000 mL | Freq: Once | INTRAVENOUS | Status: AC
  Administered 2023-07-15: 3 mL via INTRAVENOUS
  Filled 2023-07-15: qty 3

## 2023-07-15 NOTE — ED Provider Notes (Signed)
 [facility] Provider Note   CSN: [id number] Arrival date & time: 07/15/23  2042     Patient presents with: Aphasia   Edwin Garcia is a 43 y.o. male.   HPI Patient was eating dinner.  He was with his friend who is at bedside now.  He reports he felt fine and then rather abruptly felt that he could not get his words out quite right he was having difficulty thinking of the right words and at that time his left side of the face seemed to draw as well.  He reports briefly things seemed kind of far away.  He reports that the symptoms resolved and there were no other associated symptoms.  No headache no visual change no nausea no vomiting no weakness numbness or tingling of extremities.  Patient takes Biktarvy for HIV prophylaxis and reports he is compliant with it.    Prior to Admission medications   Medication Sig Start Date End Date Taking? Authorizing Provider  diltiazem  (CARDIZEM  CD) 120 MG 24 hr capsule Take 1 capsule (120 mg total) by mouth daily. 03/16/23   Burchette, Wolm ORN, MD  ibuprofen  (ADVIL ) 600 MG tablet Take 1 tablet (600 mg total) by mouth every 6 (six) hours as needed. 05/21/20   Joy, Shawn C, PA-C  ketoconazole  (NIZORAL ) 2 % shampoo Apply 1 application topically 2 (two) times a week. Use for 3 weeks.  Leave shampoo on for 3-5 minutes before rinsing. 06/25/15   Nasario Moats, PA-C  lidocaine  (LIDODERM ) 5 % Place 1 patch onto the skin daily. Remove & Discard patch within 12 hours or as directed by MD 05/21/20   Joy, Shawn C, PA-C  ondansetron  (ZOFRAN  ODT) 4 MG disintegrating tablet Take 1 tablet (4 mg total) by mouth every 8 (eight) hours as needed for nausea or vomiting. 11/15/20   Melvenia Motto, MD  ondansetron  (ZOFRAN ) 4 MG tablet Take 1 tablet (4 mg total) by mouth every 8 (eight) hours as needed for nausea or vomiting. 04/08/23   Webb, Padonda B, FNP  valACYclovir  (VALTREX ) 500 MG tablet Take one tablet BID for 3 days prn  outbreak. 01/30/20   Burchette, Wolm ORN, MD    Allergies: Cephalexin     Review of Systems  Updated Vital Signs BP 131/81   Pulse 84   Temp 97.6 F (36.4 C) (Oral)   Resp 18   Ht 5' 6 (1.676 m)   Wt 81.6 kg   SpO2 99%   BMI 29.05 kg/m   Physical Exam Constitutional:      Comments: Well-nourished well-developed.  Alert nontoxic.  Well in appearance.  Normal mental status.  HENT:     Mouth/Throat:     Mouth: Mucous membranes are moist.     Pharynx: Oropharynx is clear.   Eyes:     Extraocular Movements: Extraocular movements intact.     Conjunctiva/sclera: Conjunctivae normal.     Pupils: Pupils are equal, round, and reactive to light.    Cardiovascular:     Rate and Rhythm: Normal rate and regular rhythm.  Pulmonary:     Effort: Pulmonary effort is normal.     Breath sounds: Normal breath sounds.  Abdominal:     General: There is no distension.     Palpations: Abdomen is soft.     Tenderness: There is no abdominal tenderness. There is no guarding.   Musculoskeletal:        General: No swelling or tenderness. Normal range of motion.  Cervical back: Neck supple.     Right lower leg: No edema.     Left lower leg: No edema.   Skin:    General: Skin is warm and dry.   Neurological:     General: No focal deficit present.     Mental Status: He is oriented to person, place, and time.     Cranial Nerves: No cranial nerve deficit.     Sensory: No sensory deficit.     Motor: No weakness.     Coordination: Coordination normal.     Comments: At this time, patient's mental status is clear.  Speech is clear.  No expressive or receptive aphasia currently.  Cranial nerves II through XII intact.  Motor strength upper and lower extremity strength 5\5.  Sensation intact to light touch x 4 extremities.    (all labs ordered are listed, but only abnormal results are displayed) Labs Reviewed  COMPREHENSIVE METABOLIC PANEL WITH GFR - Abnormal; Notable for the following  components:      Result Value   Glucose, Bld 108 (*)    All other components within normal limits  CBG MONITORING, ED - Abnormal; Notable for the following components:   Glucose-Capillary 107 (*)    All other components within normal limits  PROTIME-INR  APTT  CBC  DIFFERENTIAL  ETHANOL    EKG: EKG Interpretation Date/Time:  Friday July 15 2023 20:56:59 EDT Ventricular Rate:  85 PR Interval:  139 QRS Duration:  162 QT Interval:  370 QTC Calculation: 440 R Axis:   48  Text Interpretation: Sinus rhythm Left bundle branch block no sig change from previous Confirmed by Armenta Canning [phone]) on 07/15/2023 9:03:13 PM  Radiology: CT HEAD WO CONTRAST Result Date: 07/15/2023 CLINICAL DATA:  TIA, left-sided facial droop starting 1 hour ago EXAM: CT HEAD WITHOUT CONTRAST TECHNIQUE: Contiguous axial images were obtained from the base of the skull through the vertex without intravenous contrast. RADIATION DOSE REDUCTION: This exam was performed according to the departmental dose-optimization program which includes automated exposure control, adjustment of the mA and/or kV according to patient size and/or use of iterative reconstruction technique. COMPARISON:  09/14/2004 FINDINGS: Brain: No evidence of acute infarction, hemorrhage, hydrocephalus, extra-axial collection or mass lesion/mass effect. Vascular: No hyperdense vessel or unexpected calcification. Skull: Normal. Negative for fracture or focal lesion. Sinuses/Orbits: No acute finding. Other: None. IMPRESSION: No acute intracranial pathology. No noncontrast CT evidence of acute stroke or hemorrhage. Electronically Signed   By: Marolyn JONETTA Jaksch M.D.   On: 07/15/2023 21:48     Procedures   Medications Ordered in the ED  sodium chloride  flush (NS) 0.9 % injection 3 mL (3 mLs Intravenous Given 07/15/23 2112)                                    Medical Decision Making Amount and/or Complexity of Data Reviewed Labs: ordered. Radiology:  ordered.   Patient presents as outlined with an episode of word finding or speech difficulty self-limited and a perception of things being far away and possibly facial droop.  Differential diagnosis includes TIA\CVA\conversion disorder\intracerebral tumor.  Will proceed with diagnostic workup to include CT head, screening lab work and EKG.  Lab work stable with normal white count normal differential ethanol less than 15 normal metabolic panel.  CT interpreted radiology no acute findings  With constellation of symptoms will need to proceed with MRI to rule out CVA\any intracerebral  tumor.identified by CT.  Dr. Carita accepts for ED to ED transfer.  If patient's MRI is negative, anticipate patient will be appropriate for discharge and outpatient follow-up with his PCP and neurology.     Final diagnoses:  Other speech disturbance  Left facial numbness    ED Discharge Orders     None          Armenta Canning, MD 07/15/23 [id number]

## 2023-07-15 NOTE — ED Notes (Signed)
 Pt. Just went to CT scan

## 2023-07-15 NOTE — ED Notes (Signed)
 Checked CBG 107 RN Misty informed

## 2023-07-15 NOTE — ED Triage Notes (Signed)
 Pt coming in for slurred speech, difficulty finding words, and left sided facial droop starting 1 hr ago per patient and visitor. No slurred speech or facial droop noted in triage. Pt reports symptoms have improved but that he is still having to focus on finding his words and is talking a little slower than normal. No unilateral weakness, no sensitivity deficits, no visual deficits. Pfeiffer, MD notified and instructed to order stroke workup but that activating a code stroke is not necessary at this time.

## 2023-07-16 NOTE — ED Provider Notes (Signed)
 Patient now refusing to go to [facility] for MRI tonight. States he will go to the ED there in the morning to complete his workup. Understands the risks of delay in potential diagnosis and will return sooner for any worsening symptoms.    Roselyn Carlin NOVAK, MD 07/16/23 [phone]

## 2023-07-16 NOTE — ED Notes (Signed)
 Report called to Burnard RN at [facility] ED. Pt awaiting [facility] for transport.

## 2023-07-16 NOTE — ED Notes (Signed)
 Pt requesting to leave AMA. Risks of leaving AMA including death explained to pt by EDMD and RN. Pt verbalized understanding. Signed AMA form. PIV removed. Pt out of ED with steady gait not in visible distress with partner

## 2023-07-18 ENCOUNTER — Inpatient Hospital Stay: Admitting: Family Medicine

## 2023-07-20 ENCOUNTER — Encounter: Payer: Self-pay | Admitting: Family Medicine

## 2023-07-20 ENCOUNTER — Ambulatory Visit: Admitting: Family Medicine

## 2023-07-20 VITALS — BP 122/80 | HR 77 | Temp 97.5°F | Wt 188.9 lb

## 2023-07-20 DIAGNOSIS — R471 Dysarthria and anarthria: Secondary | ICD-10-CM | POA: Diagnosis not present

## 2023-07-20 DIAGNOSIS — R4781 Slurred speech: Secondary | ICD-10-CM | POA: Diagnosis not present

## 2023-07-20 DIAGNOSIS — I639 Cerebral infarction, unspecified: Secondary | ICD-10-CM | POA: Diagnosis not present

## 2023-07-20 DIAGNOSIS — R931 Abnormal findings on diagnostic imaging of heart and coronary circulation: Secondary | ICD-10-CM

## 2023-07-20 NOTE — Progress Notes (Addendum)
 Established Patient Office Visit  Subjective   Patient ID: Edwin Garcia, male    DOB: 1980-12-16  Age: 43 y.o. MRN: [id number]  Chief Complaint  Patient presents with   Hospitalization Follow-up    HPI   Edwin Garcia is seen regarding recent ER evaluation.  He was in his usual state of health last Friday and he went with his fiance to get a hamburger.  He had finished eating and was taking a sip of beer when he noticed some slurred speech and difficulty with word finding.  Possibly some mild left facial droop.  He fell like he was staggering somewhat and had difficulty getting balance.  Symptoms lasted approximate hour.  By the time he got to the ER he had glucose 107.  [facility] but no medications that would induce hypoglycemia.  No focal weakness.  No headache.  No visual disturbance.  He had CT head which showed no acute findings.  Labs basically unremarkable.  EKG unremarkable.  Recommendation was for MRI but he ended up leaving.  [name] no longer smokes.  Does have hyperlipidemia with most recent LDL cholesterol 157.  No history of cerebrovascular disease.  He has had no recurrence of symptoms whatsoever since recent episode.  Past Medical History:  Diagnosis Date   Allergy    APHTHOUS ULCERS 05/30/2009   Bulimia    younger adult   CHALAZION, LEFT 05/30/2009   DEPRESSION 11/01/2008   Panic attack    Syphilis    Past Surgical History:  Procedure Laterality Date   ANKLE SURGERY  1999   RIGHT   TONSILLECTOMY     REMOVED AT 43 YRS OLD    reports that he has never smoked. He has never used smokeless tobacco. He reports current alcohol use. He reports that he does not use drugs. family history includes Cirrhosis in his paternal grandfather; Diabetes in his maternal grandmother; Diabetes (age of onset: 81) in his maternal grandfather; Healthy in his mother; Hyperlipidemia in his mother; Hypertension in his paternal grandmother; Hypertension (age of onset: 12) in his maternal  grandmother; Hypertension (age of onset: 11) in his maternal grandfather; Other in his father. Allergies  Allergen Reactions   Cephalexin  Nausea Only    headache    Review of Systems  Constitutional:  Negative for malaise/fatigue.  Eyes:  Negative for blurred vision.  Respiratory:  Negative for shortness of breath.   Cardiovascular:  Negative for chest pain.  Genitourinary:  Negative for dysuria.  Neurological:  Negative for dizziness, focal weakness, seizures, loss of consciousness, weakness and headaches.      Objective:     BP 122/80 (BP Location: Left Arm, Patient Position: Sitting, Cuff Size: Normal)   Pulse 77   Temp (!) 97.5 F (36.4 C) (Oral)   Wt 188 lb 14.4 oz (85.7 kg)   SpO2 97%   BMI 30.49 kg/m  BP Readings from Last 3 Encounters:  07/20/23 122/80  07/15/23 131/81  04/08/23 122/70   Wt Readings from Last 3 Encounters:  07/20/23 188 lb 14.4 oz (85.7 kg)  07/15/23 180 lb (81.6 kg)  04/08/23 183 lb (83 kg)      Physical Exam Constitutional:      Appearance: He is well-developed.  HENT:     Right Ear: External ear normal.     Left Ear: External ear normal.  Eyes:     Pupils: Pupils are equal, round, and reactive to light.  Neck:     Thyroid : No thyromegaly.  Comments: No carotid bruit Cardiovascular:     Rate and Rhythm: Normal rate and regular rhythm.  Pulmonary:     Effort: Pulmonary effort is normal. No respiratory distress.     Breath sounds: Normal breath sounds. No wheezing or rales.  Musculoskeletal:     Cervical back: Neck supple.  Neurological:     General: No focal deficit present.     Mental Status: He is alert and oriented to person, place, and time.     Cranial Nerves: No cranial nerve deficit.     Motor: No weakness.     Coordination: Coordination normal.     Gait: Gait normal.      No results found for any visits on 07/20/23.  Last CBC Lab Results  Component Value Date   WBC 7.1 07/15/2023   HGB 14.9 07/15/2023    HCT 42.7 07/15/2023   MCV 84.4 07/15/2023   MCH 29.4 07/15/2023   RDW 13.8 07/15/2023   PLT 331 07/15/2023   Last metabolic panel Lab Results  Component Value Date   GLUCOSE 108 (H) 07/15/2023   NA 139 07/15/2023   K 3.8 07/15/2023   CL 102 07/15/2023   CO2 23 07/15/2023   BUN 8 07/15/2023   CREATININE 0.96 07/15/2023   GFRNONAA >60 07/15/2023   CALCIUM  9.3 07/15/2023   PROT 7.7 07/15/2023   ALBUMIN 4.5 07/15/2023   BILITOT 0.5 07/15/2023   ALKPHOS 99 07/15/2023   AST 17 07/15/2023   ALT 11 07/15/2023   ANIONGAP 14 07/15/2023   Last lipids Lab Results  Component Value Date   CHOL 184 03/16/2023   HDL 48.20 03/16/2023   LDLCALC 122 (H) 03/16/2023   TRIG 68.0 03/16/2023   CHOLHDL 4 03/16/2023   Last hemoglobin A1c Lab Results  Component Value Date   HGBA1C 5.6 10/21/2021      The 10-year ASCVD risk score (Arnett DK, et al., 2019) is: 4.6%    Assessment & Plan:  Patient seen with recent transient slurred speech and difficulty word finding with possible left facial weakness and difficulty with balance lasting about an hour.  No definite focal extremity weakness.  Symptoms essentially resolving by the time he was in the ER.  CT head unremarkable.  No further evaluation done.  Symptoms suggested possible recent TIA.  Would recommend the following  - MRI brain -Carotid Dopplers - Transthoracic echo with bubble study - Start baby aspirin 81 mg daily - Follow-up immediately or go to ER for any recurrent neurologic symptoms - Consider repeat fasting lipids soon.  He had some kind of work screening and feels like his numbers had improved with recent weight loss.   Wolm Scarlet, MD  Carotid dopplers- mild right ICA plaque without sig stenosis  Echocardiogram- low EF 40-45%, neg bubble study, no intracardiac thrombi, regional wall abnormalities.  MRI brain- IMPRESSION: 1. Acute small vessel infarction within the left mid brain/cerebral peduncle. 2. Stroke within  the left basal ganglia and intervening radiating white matter tracts that looks older, but with a small amount of restricted diffusion that could represent a staggered insult or some residual restricted diffusion in the subacute time frame.  Recommend:  -Cardiology and Neurology referrals -continue aspirin 81 mg daily -start statin with Lipitor 20 mg daily -DAPT with ASA/Plavix  for 21 days and then ASA -home heart monitor

## 2023-07-20 NOTE — Patient Instructions (Signed)
 Will be setting up MRI brain, carotid doppler, and echocardiogram   Start baby aspirin 81 mg once daily.   Follow up for any recurrent neurologic symptoms.

## 2023-07-21 ENCOUNTER — Ambulatory Visit
Admission: RE | Admit: 2023-07-21 | Discharge: 2023-07-21 | Disposition: A | Discharge status: Home / Self Care | Source: Ambulatory Visit | Attending: Family Medicine | Admitting: Family Medicine

## 2023-07-21 DIAGNOSIS — R471 Dysarthria and anarthria: Secondary | ICD-10-CM

## 2023-07-21 DIAGNOSIS — R4781 Slurred speech: Secondary | ICD-10-CM

## 2023-07-24 ENCOUNTER — Ambulatory Visit: Payer: Self-pay | Admitting: Family Medicine

## 2023-07-25 ENCOUNTER — Ambulatory Visit
Admission: RE | Admit: 2023-07-25 | Discharge: 2023-07-25 | Disposition: A | Discharge status: Home / Self Care | Source: Ambulatory Visit | Attending: Family Medicine | Admitting: Family Medicine

## 2023-07-25 DIAGNOSIS — R4781 Slurred speech: Secondary | ICD-10-CM

## 2023-07-25 DIAGNOSIS — R471 Dysarthria and anarthria: Secondary | ICD-10-CM

## 2023-07-26 ENCOUNTER — Other Ambulatory Visit: Payer: Self-pay | Admitting: Family Medicine

## 2023-07-26 ENCOUNTER — Ambulatory Visit (HOSPITAL_COMMUNITY)
Admission: RE | Admit: 2023-07-26 | Discharge: 2023-07-26 | Disposition: A | Discharge status: Home / Self Care | Source: Ambulatory Visit | Attending: Cardiology | Admitting: Cardiology

## 2023-07-26 DIAGNOSIS — R4781 Slurred speech: Secondary | ICD-10-CM | POA: Insufficient documentation

## 2023-07-26 DIAGNOSIS — G459 Transient cerebral ischemic attack, unspecified: Secondary | ICD-10-CM | POA: Diagnosis not present

## 2023-07-26 DIAGNOSIS — R471 Dysarthria and anarthria: Secondary | ICD-10-CM | POA: Insufficient documentation

## 2023-07-26 LAB — ECHOCARDIOGRAM COMPLETE BUBBLE STUDY: S' Lateral: 4.25 cm

## 2023-07-26 MED ORDER — CLOPIDOGREL BISULFATE 75 MG PO TABS: 75.0000 mg | ORAL_TABLET | Freq: Every day | ORAL | 0 refills | Status: DC

## 2023-07-26 MED ORDER — ATORVASTATIN CALCIUM 20 MG PO TABS: 20.0000 mg | ORAL_TABLET | Freq: Every day | ORAL | 5 refills | Status: DC

## 2023-07-26 MED ORDER — PERFLUTREN LIPID MICROSPHERE
1.0000 mL | INTRAVENOUS | Status: AC | PRN
  Administered 2023-07-26: 3 mL via INTRAVENOUS

## 2023-07-26 NOTE — Addendum Note (Signed)
 Addended by: MICHEAL WOLM ORN on: 07/26/2023 09:39 PM   Modules accepted: Orders

## 2023-07-27 ENCOUNTER — Ambulatory Visit

## 2023-07-27 DIAGNOSIS — I639 Cerebral infarction, unspecified: Secondary | ICD-10-CM

## 2023-07-27 NOTE — Progress Notes (Unsigned)
 EP to read.

## 2023-08-09 ENCOUNTER — Encounter: Payer: Self-pay | Admitting: Neurology

## 2023-08-09 ENCOUNTER — Ambulatory Visit: Admitting: Neurology

## 2023-08-09 VITALS — BP 136/80 | Ht 66.0 in | Wt 192.0 lb

## 2023-08-09 DIAGNOSIS — I6381 Other cerebral infarction due to occlusion or stenosis of small artery: Secondary | ICD-10-CM | POA: Diagnosis not present

## 2023-08-09 MED ORDER — ATORVASTATIN CALCIUM 80 MG PO TABS: 80.0000 mg | ORAL_TABLET | Freq: Every day | ORAL | 3 refills | Status: AC

## 2023-08-09 MED ORDER — ASPIRIN 81 MG PO TBEC: 81.0000 mg | DELAYED_RELEASE_TABLET | Freq: Every day | ORAL | 3 refills | Status: AC

## 2023-08-09 NOTE — Patient Instructions (Addendum)
 Discontinue Plavix  and start Aspirin  81 mg daily  Increase Atorvastatin  to 80 mg daily  Continue to follow up with PCP  Return sooner if worse

## 2023-08-09 NOTE — Progress Notes (Signed)
 [facility]  PATIENT: Edwin Garcia DOB: 12/04/1980  REQUESTING CLINICIAN: Micheal Wolm ORN, MD HISTORY FROM: Patient  REASON FOR VISIT: Acute stroke    HISTORICAL  CHIEF COMPLAINT:  Chief Complaint  Patient presents with   New Patient (Initial Visit)    Rm 12, NP, TIA, left eye still twitches    HISTORY OF PRESENT ILLNESS:  This is a 43 year old gentleman with vascular risk factors including hypertension, hyperlipidemia who is presenting after being diagnosed with acute stroke.  Patient report on June 27, he did have sudden onset of word finding difficulty and left facial weakness.  This occurred suddenly, there was no weakness, numbness or tingling of the extremities, and no headaches, no double vision and no loss in vision.  He presented to the ED, had a head CT which was negative, was recommended to go to [facility] for [facility] but he left AMA and follow-up with his PCP the next days.  He did have an MRI which show acute to subacute left midbrain stroke.  There was also an old stroke in the left basal ganglia.  His carotid duplex did not show any critical stenosis.  Patient stroke etiology likely small vessel disease, he was started on Plavix .  Patient was not on any antiplatelet prior to admission.  He was also recommended atorvastatin , 20 mg.  He reports compliance with the medication, stated his speech is back to normal and currently does not have any deficit.  OTHER MEDICAL CONDITIONS: Hypertension, Hyperlipidemia    REVIEW OF SYSTEMS: Full 14 system review of systems performed and negative with exception of: As noted in the HPI   ALLERGIES: Allergies  Allergen Reactions   Cephalexin  Nausea Only    headache    HOME MEDICATIONS: Outpatient Medications Prior to Visit  Medication Sig Dispense Refill   diltiazem  (CARDIZEM  CD) 120 MG 24 hr capsule Take 1 capsule (120 mg total) by mouth daily. 90 capsule 3   ibuprofen  (ADVIL ) 600 MG tablet Take 1  tablet (600 mg total) by mouth every 6 (six) hours as needed. 30 tablet 0   ketoconazole  (NIZORAL ) 2 % shampoo Apply 1 application topically 2 (two) times a week. Use for 3 weeks.  Leave shampoo on for 3-5 minutes before rinsing. 120 mL 0   lidocaine  (LIDODERM ) 5 % Place 1 patch onto the skin daily. Remove & Discard patch within 12 hours or as directed by MD 30 patch 0   ondansetron  (ZOFRAN  ODT) 4 MG disintegrating tablet Take 1 tablet (4 mg total) by mouth every 8 (eight) hours as needed for nausea or vomiting. 12 tablet 0   ondansetron  (ZOFRAN ) 4 MG tablet Take 1 tablet (4 mg total) by mouth every 8 (eight) hours as needed for nausea or vomiting. 20 tablet 0   valACYclovir  (VALTREX ) 500 MG tablet Take one tablet BID for 3 days prn outbreak. 30 tablet 2   atorvastatin  (LIPITOR) 20 MG tablet Take 1 tablet (20 mg total) by mouth daily. 30 tablet 5   clopidogrel  (PLAVIX ) 75 MG tablet Take 1 tablet (75 mg total) by mouth daily. 30 tablet 0   No facility-administered medications prior to visit.    PAST MEDICAL HISTORY: Past Medical History:  Diagnosis Date   Allergy    APHTHOUS ULCERS 05/30/2009   Bulimia    younger adult   CHALAZION, LEFT 05/30/2009   DEPRESSION 11/01/2008   Panic attack    Syphilis    TIA (transient ischemic attack)  PAST SURGICAL HISTORY: Past Surgical History:  Procedure Laterality Date   ANKLE SURGERY  1999   RIGHT   TONSILLECTOMY     REMOVED AT 43 YRS OLD    FAMILY HISTORY: Family History  Problem Relation Age of Onset   Hyperlipidemia Mother    Healthy Mother    Other Father        smoker- lung nodule   Hypertension Maternal Grandmother 22   Diabetes Maternal Grandmother    Diabetes Maternal Grandfather 47       TYPE II   Hypertension Maternal Grandfather 45   Hypertension Paternal Grandmother    Cirrhosis Paternal Grandfather        cirrhosis alcohol related, also smoker- ? lung cancer   Alcohol abuse Neg Hx        family   Arthritis Neg Hx         family   Cancer Neg Hx        family hx - breast ,lung,colon,prostate ca   Depression Neg Hx        family    SOCIAL HISTORY: Social History   Socioeconomic History   Marital status: Single    Spouse name: Not on file   Number of children: Not on file   Years of education: Not on file   Highest education level: Not on file  Occupational History   Occupation: SECURITY    Employer: [employer]  Tobacco Use   Smoking status: Never   Smokeless tobacco: Never  Vaping Use   Vaping status: Former  Substance and Sexual Activity   Alcohol use: Yes    Comment: 7   Drug use: No   Sexual activity: Yes    Birth control/protection: Condom    Comment: NUMBER OF SEX PARTNERS IN 12 MOS - 6, Homosexual male  Other Topics Concern   Not on file  Social History Narrative   Lives alone. Monogomous partner for 1 year year in 11/2015. Partner HIV positive. HD testing 2-3x a year.       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    Social Drivers of Corporate investment banker Strain: Not on file  Food Insecurity: Not on file  Transportation Needs: Not on file  Physical Activity: Not on file  Stress: Not on file  Social Connections: Not on file  Intimate Partner Violence: Not on file    PHYSICAL EXAM  GENERAL EXAM/CONSTITUTIONAL: Vitals:  Vitals:   08/09/23 1137  BP: 136/80  Weight: 192 lb (87.1 kg)  Height: 5' 6 (1.676 m)   Body mass index is 30.99 kg/m. Wt Readings from Last 3 Encounters:  08/09/23 192 lb (87.1 kg)  07/20/23 188 lb 14.4 oz (85.7 kg)  07/15/23 180 lb (81.6 kg)   Patient is in no distress; well developed, nourished and groomed; neck is supple  MUSCULOSKELETAL: Gait, strength, tone, movements noted in Neurologic exam below  NEUROLOGIC: MENTAL STATUS:      No data to display         awake, alert, oriented to person, place and time recent and remote memory  intact normal attention and concentration language fluent, comprehension intact, naming intact fund of knowledge appropriate  CRANIAL NERVE:  2nd, 3rd, 4th, 6th - Visual fields full to confrontation, extraocular muscles intact, no nystagmus 5th - facial sensation symmetric.  7th - very mild left lower facial weakness seen  when smiling 8th - hearing intact 9th - palate elevates symmetrically, uvula midline 11th - shoulder shrug symmetric 12th - tongue protrusion midline  MOTOR:  normal bulk and tone, full strength in the BUE, BLE. There is mild left eyelid drooping  SENSORY:  normal and symmetric to light touch  COORDINATION:  finger-nose-finger, fine finger movements normal  GAIT/STATION:  normal     DIAGNOSTIC DATA (LABS, IMAGING, TESTING) - I reviewed patient records, labs, notes, testing and imaging myself where available.  Lab Results  Component Value Date   WBC 7.1 07/15/2023   HGB 14.9 07/15/2023   HCT 42.7 07/15/2023   MCV 84.4 07/15/2023   PLT 331 07/15/2023      Component Value Date/Time   NA 139 07/15/2023 2105   K 3.8 07/15/2023 2105   CL 102 07/15/2023 2105   CO2 23 07/15/2023 2105   GLUCOSE 108 (H) 07/15/2023 2105   BUN 8 07/15/2023 2105   CREATININE 0.96 07/15/2023 2105   CREATININE 0.86 11/12/2011 1509   CALCIUM  9.3 07/15/2023 2105   PROT 7.7 07/15/2023 2105   ALBUMIN 4.5 07/15/2023 2105   AST 17 07/15/2023 2105   ALT 11 07/15/2023 2105   ALKPHOS 99 07/15/2023 2105   BILITOT 0.5 07/15/2023 2105   GFRNONAA >60 07/15/2023 2105   GFRAA >60 09/09/2015 2300   Lab Results  Component Value Date   CHOL 184 03/16/2023   HDL 48.20 03/16/2023   LDLCALC 122 (H) 03/16/2023   TRIG 68.0 03/16/2023   CHOLHDL 4 03/16/2023   Lab Results  Component Value Date   HGBA1C 5.6 10/21/2021   No results found for: VITAMINB12 Lab Results  Component Value Date   TSH 1.62 10/21/2021    MRI Brain 07/25/2023 1. Acute small vessel infarction within the  left mid brain/cerebral peduncle. 2. Stroke within the left basal ganglia and intervening radiating white matter tracts that looks older, but with a small amount of restricted diffusion that could represent a staggered insult or some residual restricted diffusion in the subacute time frame.   US  07/21/2023 1. Mild right ICA plaque without significant stenosis. 2. No significant left ICA plaque or stenosis. 3. Antegrade bilateral vertebral arterial flow.    ASSESSMENT AND PLAN  43 y.o. year old male with vascular risk factor including hypertension, hyperlipidemia who is presenting with acute stroke.  Patient initial symptoms were word finding difficulty and left facial weakness.  He did have a left midbrain stroke and left basal ganglia.  Patient stroke etiology likely small vessel disease.  He is already on clopidogrel , was not taking antiplatelet prior to the stroke.  Advised patient to discontinue clopidogrel  and start with aspirin  81 mg daily.  He is also on Lipitor 20 mg, will increase it to 80 mg daily.  Advised patient to continue follow-up with PCP, and to call us  if his symptoms do get worse or if he has any new deficit.  He voiced understanding.  Return sooner if worse.   1. Cerebrovascular accident (CVA) due to occlusion of small artery [facility])      Patient Instructions  Discontinue Plavix  and start Aspirin  81 mg daily  Increase Atorvastatin  to 80 mg daily  Continue to follow up with PCP  Return sooner if worse   No orders of the defined types were placed in this encounter.   Meds ordered this encounter  Medications   atorvastatin  (LIPITOR) 80 MG tablet    Sig: Take 1 tablet (80 mg total) by mouth daily.  Dispense:  90 tablet    Refill:  3   aspirin  EC 81 MG tablet    Sig: Take 1 tablet (81 mg total) by mouth daily. Swallow whole.    Dispense:  90 tablet    Refill:  3    Return if symptoms worsen or fail to improve.    Pastor Falling, MD 08/09/2023, 1:05  PM  [facility] [address], [address] [place], [place] [address] [phone]

## 2023-08-24 ENCOUNTER — Other Ambulatory Visit: Payer: Self-pay | Admitting: Family Medicine

## 2023-08-28 DIAGNOSIS — I639 Cerebral infarction, unspecified: Secondary | ICD-10-CM

## 2023-11-07 ENCOUNTER — Encounter: Payer: Self-pay | Admitting: Family Medicine

## 2023-11-07 ENCOUNTER — Ambulatory Visit (INDEPENDENT_AMBULATORY_CARE_PROVIDER_SITE_OTHER): Admitting: Family Medicine

## 2023-11-07 ENCOUNTER — Ambulatory Visit: Payer: Self-pay

## 2023-11-07 ENCOUNTER — Ambulatory Visit: Payer: Self-pay | Admitting: Family Medicine

## 2023-11-07 VITALS — BP 134/86 | HR 75 | Temp 97.6°F | Wt 193.2 lb

## 2023-11-07 DIAGNOSIS — Z833 Family history of diabetes mellitus: Secondary | ICD-10-CM | POA: Diagnosis not present

## 2023-11-07 DIAGNOSIS — L739 Follicular disorder, unspecified: Secondary | ICD-10-CM

## 2023-11-07 DIAGNOSIS — I10 Essential (primary) hypertension: Secondary | ICD-10-CM | POA: Diagnosis not present

## 2023-11-07 DIAGNOSIS — E785 Hyperlipidemia, unspecified: Secondary | ICD-10-CM | POA: Diagnosis not present

## 2023-11-07 LAB — COMPREHENSIVE METABOLIC PANEL WITH GFR
ALT: 28 U/L (ref 0–53)
AST: 27 U/L (ref 0–37)
Albumin: 4.5 g/dL (ref 3.5–5.2)
Alkaline Phosphatase: 108 U/L (ref 39–117)
BUN: 9 mg/dL (ref 6–23)
CO2: 28 meq/L (ref 19–32)
Calcium: 9.3 mg/dL (ref 8.4–10.5)
Chloride: 103 meq/L (ref 96–112)
Creatinine, Ser: 0.83 mg/dL (ref 0.40–1.50)
GFR: 109.1 mL/min (ref >60.00)
Glucose, Bld: 91 mg/dL (ref 70–99)
Potassium: 3.9 meq/L (ref 3.5–5.1)
Sodium: 138 meq/L (ref 135–145)
Total Bilirubin: 0.6 mg/dL (ref 0.2–1.2)
Total Protein: 7.7 g/dL (ref 6.0–8.3)

## 2023-11-07 LAB — HEMOGLOBIN A1C: Hgb A1c MFr Bld: 5.6 % (ref 4.6–6.5)

## 2023-11-07 LAB — LIPID PANEL
Cholesterol: 119 mg/dL (ref 0–200)
HDL: 50.2 mg/dL (ref >39.00)
LDL Cholesterol: 59 mg/dL (ref 0–99)
NonHDL: 68.52
Total CHOL/HDL Ratio: 2
Triglycerides: 46 mg/dL (ref 0.0–149.0)
VLDL: 9.2 mg/dL (ref 0.0–40.0)

## 2023-11-07 MED ORDER — DOXYCYCLINE HYCLATE 100 MG PO CAPS: 100.0000 mg | ORAL_CAPSULE | Freq: Two times a day (BID) | ORAL | 0 refills | Status: DC

## 2023-11-07 NOTE — Telephone Encounter (Signed)
 Appt today

## 2023-11-07 NOTE — Telephone Encounter (Signed)
 FYI Only or Action Required?: FYI only for provider.  Patient was last seen in primary care on 07/20/2023 by Edwin Wolm ORN, Edwin Garcia.  Called Nurse Triage reporting Neurologic Problem.  Symptoms began 2 weeks ago.  Interventions attempted: Nothing.  Symptoms are: stable.  Triage Disposition: See HCP Within 4 Hours (Or PCP Triage)  Patient/caregiver understands and will follow disposition?: Yes                           Copied from CRM [id number]. Topic: Clinical - Red Word Triage >> Nov 07, 2023  8:32 AM Frederich PARAS wrote: [facility] that prompted transfer to Nurse Triage: tingleness on left leg  Pt has scab on right leg been there for a week or 2, he usually greases up everyday, just scabby, itcy sometimes, no pain or anything. pt wouldlike to check sugar as well. When doing DT pt says left leg tingled  pt did mention he had a stroke in past and tried to be ahead of things . Reason for Disposition  Nursing judgment or information in reference  Answer Assessment - Initial Assessment Questions 1. SYMPTOM: What is the main symptom you are concerned about? (e.g., weakness, numbness)     Tingling in left foot that went away, describes tingling as pins and needles 2. ONSET: When did this start? (e.g., minutes, hours, days; while sleeping)     Friday morning, happened briefly after waking up from sleeping and walking to bathroom- lasted for a few minutes 3. LAST NORMAL: When was the last time you (the patient) were normal (no symptoms)?     Today 4. PATTERN Does this come and go, or has it been constant since it started?  Is it present now?     Happened one time 5. CARDIAC SYMPTOMS: Have you had any of the following symptoms: chest pain, difficulty breathing, palpitations?     Denies difficulty breathing, denies chest pain 6. NEUROLOGIC SYMPTOMS: Have you had any of the following symptoms: headache, dizziness, vision loss, double vision, changes in speech,  unsteady on your feet?     Denies headache, denies dizziness, denies balance issues, denies changes in vision, denies changes in speech, denies facial drooping, denies one-sided weakness, states he has a history of a stroke and symptoms like these pop-up from time to time 7. OTHER SYMPTOMS: Do you have any other symptoms?     Scab on right leg 8. PREGNANCY: Is there any chance you are pregnant? When was your last menstrual period?     N/A  Answer Assessment - Initial Assessment Questions 1. APPEARANCE What does the injury look like?      Brown and scabby 2. ONSET: How long ago did the injury occur?      2 weeks  3. LOCATION: Where is the injury located?      Right lower leg 4. SIZE: How large is the cut?      Smaller than a dime 5. BLEEDING: Is it bleeding now? If Yes, ask: Is it difficult to stop?      Denies bleeding, denies drainage, denies fever 6. PAIN: Is there any pain? If Yes, ask: How bad is the pain? (Scale 0-10; or none, mild, moderate, severe)     Denies fever 7. MECHANISM: Tell me how it happened.      Unsure 8. TETANUS: When was your last tetanus booster?     States he is up to date 9. PREGNANCY: Is there any  chance you are pregnant? When was your last menstrual period?     N/A  Protocols used: Neurologic Deficit-A-AH, Skin Injury-A-AH, No Guideline Available-A-AH

## 2023-11-07 NOTE — Patient Instructions (Signed)
 Let me know if rash not improving over next couple of weeks.

## 2023-11-07 NOTE — Progress Notes (Signed)
 Established Patient Office Visit  Subjective   Patient ID: Edwin Garcia, male    DOB: 07-06-1980  Age: 43 y.o. MRN: [id number]  Chief Complaint  Patient presents with   Insect Bite    HPI   Elspeth is seen for the following items today  Leg rash of both legs past couple weeks.  He was in [place]  prior to onset of rash but does not know of any specific bites.  He was outdoors quite a bit though.  He has small pustules on lower legs which are slightly pruritic.  No associated pain.  He uses Dial soap at baseline.  No involvement of upper extremities or trunk.  He does have a dog but dogs mostly indoors and no reported fleas  Dejion last summer had difficulty with word finding and left facial weakness.  Went initially to the ED and had CT which was negative.  MRI was recommended but he left AMA and ended up following up with us .  We obtained MRI which showed acute to subacute left midbrain stroke and also evidence for old stroke left basal ganglia.  Carotid duplex showed no significant stenosis.  Was referred to neurology and they felt like he likely had small vessel stroke and was taken off Plavix  and continued on aspirin  and atorvastatin  increased to 80 mg.  He had no residual issues since then.  Does have family history of type 2 diabetes.  No recent A1c.  Currently takes aspirin , atorvastatin  80 mg daily, and Cardizem  CD 120 mg daily.  He has lost some weight and blood pressure generally fairly well-controlled though up slightly today.  Past Medical History:  Diagnosis Date   Allergy    APHTHOUS ULCERS 05/30/2009   Bulimia (HCC)    younger adult   CHALAZION, LEFT 05/30/2009   DEPRESSION 11/01/2008   Panic attack    Syphilis    TIA (transient ischemic attack)    Past Surgical History:  Procedure Laterality Date   ANKLE SURGERY  1999   RIGHT   TONSILLECTOMY     REMOVED AT 43 YRS OLD    reports that he has never smoked. He has never used smokeless tobacco. He reports  current alcohol use. He reports that he does not use drugs. family history includes Cirrhosis in his paternal grandfather; Diabetes in his maternal grandmother; Diabetes (age of onset: 21) in his maternal grandfather; Healthy in his mother; Hyperlipidemia in his mother; Hypertension in his paternal grandmother; Hypertension (age of onset: 45) in his maternal grandmother; Hypertension (age of onset: 31) in his maternal grandfather; Other in his father. Allergies  Allergen Reactions   Cephalexin  Nausea Only    headache      Review of Systems  Constitutional:  Negative for chills and fever.  Respiratory:  Negative for shortness of breath.   Cardiovascular:  Negative for chest pain.  Skin:  Positive for itching and rash.  Neurological:  Negative for speech change and focal weakness.      Objective:     BP 134/86   Pulse 75   Temp 97.6 F (36.4 C) (Oral)   Wt 193 lb 3.2 oz (87.6 kg)   SpO2 99%   BMI 31.18 kg/m  BP Readings from Last 3 Encounters:  11/07/23 134/86  08/09/23 136/80  07/20/23 122/80   Wt Readings from Last 3 Encounters:  11/07/23 193 lb 3.2 oz (87.6 kg)  08/09/23 192 lb (87.1 kg)  07/20/23 188 lb 14.4 oz (85.7 kg)  Physical Exam Vitals reviewed.  Constitutional:      General: He is not in acute distress.    Appearance: He is not ill-appearing.  Cardiovascular:     Rate and Rhythm: Normal rate and regular rhythm.  Pulmonary:     Effort: Pulmonary effort is normal.     Breath sounds: Normal breath sounds. No wheezing or rales.  Skin:    Findings: Rash present.     Comments: Has some scattered small pustules lower legs bilaterally.  Has a couple of small scattered papules as well.  Most of these are just couple millimeters diameter.  No cellulitis changes.  Neurological:     General: No focal deficit present.     Mental Status: He is alert.     Motor: No weakness.      No results found for any visits on 11/07/23.    The ASCVD Risk score  (Arnett DK, et al., 2019) failed to calculate for the following reasons:   Risk score cannot be calculated because patient has a medical history suggesting prior/existing ASCVD    Assessment & Plan:   #1 follicular rash.  Etiology unclear.  Possibly related to bites but they are pustular in nature and we recommended covering with doxycycline  100 mg twice daily for 7 days.  Continue Dial soap.  Be in touch if these not clearing over the next couple weeks  #2 history of CVA-small vessel CVA.  Patient on aspirin .  We discussed secondary prevention with goal blood pressure 130/80 or less, LDL less than 70, A1c less than 6.5  #3 hyperlipidemia.  Patient on rosuvastatin 80 mg daily.  Not had labs since going on higher dose.  Check lipid panel and CMP.  Goal LDL less than 70.  Continue low saturated fat diet  #4 hypertension.  Up just slightly today.  Goal 130/80 or less.  Continue low-sodium diet.  Continue weight control efforts.  Continue close monitoring.   No follow-ups on file.    Wolm Scarlet, MD

## 2023-11-18 MED ORDER — DOXYCYCLINE HYCLATE 100 MG PO CAPS: 100.0000 mg | ORAL_CAPSULE | Freq: Two times a day (BID) | ORAL | 0 refills | Status: DC

## 2023-11-18 NOTE — Telephone Encounter (Signed)
 Antibiotic refill sent  Wolm LELON Scarlet MD [facility]

## 2023-11-28 MED ORDER — DICLOXACILLIN SODIUM 500 MG PO CAPS: ORAL_CAPSULE | ORAL | 0 refills | Status: DC

## 2023-11-28 NOTE — Addendum Note (Signed)
 Addended by: METTA KRISTEN CROME on: 11/28/2023 12:54 PM   Modules accepted: Orders

## 2023-11-28 NOTE — Addendum Note (Signed)
 Addended by: METTA KRISTEN CROME on: 11/28/2023 01:04 PM   Modules accepted: Orders

## 2023-12-07 NOTE — Progress Notes (Signed)
 Cardiology Office Note:    Date:  12/13/2023   ID:  Edwin Garcia, DOB [birth year], MRN [id number]  PCP:  Micheal Wolm ORN, MD   [facility] Providers Cardiologist:  None     Referring MD: Micheal Wolm ORN, MD   Chief Complaint  Patient presents with   Congestive Heart Failure    History of Present Illness:    Edwin Garcia is a 43 y.o. male seen at the request of Dr Micheal for evaluation of LV dysfunction noted on Echo. In July he had an echo for evaluation of CVA showing EF 40-45% with regional wall motion abnormalities. He has a chronic LBBB dating back to 2017.   He denies any cardiac complaints. No chest pain, dyspnea, palpitations, dizziness or edema. Symptoms with CVA included slurred speech, facial droop and some stumbling.   Past Medical History:  Diagnosis Date   Allergy    APHTHOUS ULCERS 05/30/2009   Bulimia (HCC)    younger adult   CHALAZION, LEFT 05/30/2009   DEPRESSION 11/01/2008   Hypercholesterolemia    Hypertension    Panic attack    Stroke (cerebrum) (HCC)    Syphilis    TIA (transient ischemic attack)     Past Surgical History:  Procedure Laterality Date   ANKLE SURGERY  1999   RIGHT   TONSILLECTOMY     REMOVED AT 43 YRS OLD    Current Medications: Current Meds  Medication Sig   aspirin  EC 81 MG tablet Take 1 tablet (81 mg total) by mouth daily. Swallow whole.   atorvastatin  (LIPITOR) 80 MG tablet Take 1 tablet (80 mg total) by mouth daily.   dicloxacillin  (DYNAPEN ) 500 MG capsule Take 1 capsule every 6 hours for 7 days   doxycycline  (VIBRAMYCIN ) 100 MG capsule Take 1 capsule (100 mg total) by mouth 2 (two) times daily.   ibuprofen  (ADVIL ) 600 MG tablet Take 1 tablet (600 mg total) by mouth every 6 (six) hours as needed.   ketoconazole  (NIZORAL ) 2 % shampoo Apply 1 application topically 2 (two) times a week. Use for 3 weeks.  Leave shampoo on for 3-5 minutes before rinsing.   lidocaine  (LIDODERM ) 5 % Place 1 patch  onto the skin daily. Remove & Discard patch within 12 hours or as directed by MD   metoprolol  tartrate (LOPRESSOR ) 50 MG tablet Take 50 mg 2 hours before Coronary CT   ondansetron  (ZOFRAN  ODT) 4 MG disintegrating tablet Take 1 tablet (4 mg total) by mouth every 8 (eight) hours as needed for nausea or vomiting.   ondansetron  (ZOFRAN ) 4 MG tablet Take 1 tablet (4 mg total) by mouth every 8 (eight) hours as needed for nausea or vomiting.   sacubitril -valsartan  (ENTRESTO ) 24-26 MG Take 1 tablet by mouth 2 (two) times daily.   valACYclovir  (VALTREX ) 500 MG tablet Take one tablet BID for 3 days prn outbreak.   [DISCONTINUED] diltiazem  (CARDIZEM  CD) 120 MG 24 hr capsule Take 1 capsule (120 mg total) by mouth daily.     Allergies:   Cephalexin    Social History   Socioeconomic History   Marital status: Single    Spouse name: Not on file   Number of children: Not on file   Years of education: Not on file   Highest education level: Not on file  Occupational History   Occupation: SECURITY    Employer: [employer]  Tobacco Use   Smoking status: Never   Smokeless tobacco: Never  Vaping Use  Vaping status: Former  Substance and Sexual Activity   Alcohol use: Yes    Comment: 7   Drug use: No   Sexual activity: Yes    Birth control/protection: Condom    Comment: NUMBER OF SEX PARTNERS IN 12 MOS - 6, Homosexual male  Other Topics Concern   Not on file  Social History Narrative   Lives alone. Monogomous partner for 1 year year in 11/2015. Partner HIV positive. HD testing 2-3x a year.       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    Social Drivers of Corporate Investment Banker Strain: Not on file  Food Insecurity: Not on file  Transportation Needs: Not on file  Physical Activity: Not on file  Stress: Not on file  Social Connections: Not on file      Family History: The patient's family history includes Cirrhosis in his paternal grandfather; Diabetes in his maternal grandmother; Diabetes (age of onset: 53) in his maternal grandfather; Hyperlipidemia in his mother; Hypertension in his mother and paternal grandmother; Hypertension (age of onset: 18) in his maternal grandmother; Hypertension (age of onset: 21) in his maternal grandfather; Other in his father. There is no history of Alcohol abuse, Arthritis, Cancer, or Depression.  ROS:   Please see the history of present illness.     All other systems reviewed and are negative.  EKGs/Labs/Other Studies Reviewed:    The following studies were reviewed today:  EKG Interpretation Date/Time:  Tuesday December 13 2023 11:11:00 EST Ventricular Rate:  65 PR Interval:  148 QRS Duration:  158 QT Interval:  436 QTC Calculation: 453 R Axis:   -1  Text Interpretation: Normal sinus rhythm with sinus arrhythmia Left bundle branch block When compared with ECG of 15-Jul-2023 20:56, No significant change was found Confirmed by [place] [phone]) on 12/13/2023 11:13:25 AM   Echo 07/26/23: IMPRESSIONS     1. Left ventricular ejection fraction, by estimation, is 40 to 45%. The  left ventricle has mildly decreased function. The left ventricle  demonstrates regional wall motion abnormalities (see scoring  diagram/findings for description). Left ventricular  diastolic parameters are consistent with Grade I diastolic dysfunction  (impaired relaxation). The average left ventricular global longitudinal  strain is -11.0 %. The global longitudinal strain is abnormal.  LV Wall Scoring:  The mid anteroseptal segment and mid inferoseptal segment are dyskinetic.  The  entire inferior wall, basal anteroseptal segment, apical septal segment,  basal inferoseptal segment, and apex are hypokinetic.    2. Right ventricular systolic function is normal. The right ventricular  size is normal. Tricuspid  regurgitation signal is inadequate for assessing  PA pressure.   3. The mitral valve is normal in structure. Trivial mitral valve  regurgitation. No evidence of mitral stenosis.   4. The aortic valve was not well visualized. Aortic valve regurgitation  is not visualized. No aortic stenosis is present.   5. The inferior vena cava is normal in size with greater than 50%  respiratory variability, suggesting right atrial pressure of 3 mmHg.   6. Agitated saline contrast bubble study was negative, with no evidence  of any interatrial shunt.   Comparison(s): No prior Echocardiogram.   Conclusion(s)/Recommendation(s): No intracardiac source of embolism  detected on this transthoracic study. Consider a transesophageal  echocardiogram to exclude cardiac source of embolism if clinically  indicated. No left ventricular mural or apical  thrombus/thrombi. Consider ischemia evaluation, clinical correlation  required.   Event monitor 08/25/23: Study Highlights Show Result ComparisonNSR with sinus brady (52/min) and Sinus tachy (160/min), ave 83/min. Symptoms associated with NSR and sinus tachy Rare PVC's and PAC's (<1%) No VT, SVT or atrial fib. No prolonged pauses. IVCD/BBB present Patch Wear Time:  13 days and 23 hours (2025-07-15T22:54:23-0400 to 2025-07-29T22:12:16-0400)  Recent Labs: 07/15/2023: Hemoglobin 14.9; Platelets 331 11/07/2023: ALT 28; BUN 9; Creatinine, Ser 0.83; Potassium 3.9; Sodium 138  Recent Lipid Panel    Component Value Date/Time   CHOL 119 11/07/2023 1059   TRIG 46.0 11/07/2023 1059   HDL 50.20 11/07/2023 1059   CHOLHDL 2 11/07/2023 1059   VLDL 9.2 11/07/2023 1059   LDLCALC 59 11/07/2023 1059     Risk Assessment/Calculations:        Physical Exam:    VS:  BP (!) 136/98 (BP Location: Left Arm, Patient Position: Sitting, Cuff Size: Large)   Pulse 65   Ht 5' 6 (1.676 m)   Wt 196 lb 12.8 oz (89.3 kg)   SpO2 99%   BMI 31.76 kg/m     Wt Readings from Last 3  Encounters:  12/13/23 196 lb 12.8 oz (89.3 kg)  12/09/23 193 lb 11.2 oz (87.9 kg)  11/07/23 193 lb 3.2 oz (87.6 kg)     GEN:  Well nourished, well developed in no acute distress HEENT: Normal NECK: No JVD; No carotid bruits LYMPHATICS: No lymphadenopathy CARDIAC: RRR, no murmurs, rubs, gallops RESPIRATORY:  Clear to auscultation without rales, wheezing or rhonchi  ABDOMEN: Soft, non-tender, non-distended MUSCULOSKELETAL:  No edema; No deformity  SKIN: Warm and dry NEUROLOGIC:  Alert and oriented x 3 PSYCHIATRIC:  Normal affect   ASSESSMENT:    1. Chronic systolic congestive heart failure (HCC)   2. Essential hypertension   3. History of CVA (cerebrovascular accident)   4. Primary hypertension   5. LBBB (left bundle branch block)    PLAN:    In order of problems listed above:  Chronic systolic CHF. EF 40-45%. Asymptomatic. Suspect dyssynergy from LBBB is a major contributor. Recommend stopping diltiazem . Will start Entresto  24/26 mg bid. Arrange coronary CTA to assess for CAD. Will arrange follow up for further titration of CHF therapy. Plan to repeat Echo once meds optimized.  S/p CVA HTN HLD on statin.            Medication Adjustments/Labs and Tests Ordered: Current medicines are reviewed at length with the patient today.  Concerns regarding medicines are outlined above.  Orders Placed This Encounter  Procedures   CT CORONARY MORPH W/CTA COR W/SCORE W/CA W/CM &/OR WO/CM   EKG 12-Lead   Meds ordered this encounter  Medications   sacubitril -valsartan  (ENTRESTO ) 24-26 MG    Sig: Take 1 tablet by mouth 2 (two) times daily.    Dispense:  60 tablet    Refill:  6   metoprolol  tartrate (LOPRESSOR ) 50 MG tablet    Sig: Take 50 mg 2 hours before Coronary CT    Dispense:  1 tablet    Refill:  0    Patient Instructions  Medication Instructions:  Stop Diltiazem   Start Entresto  2/26 mg twice a day Continue all other medications *If you need a refill on your cardiac  medications before your next appointment, please call your pharmacy*  Lab Work: None ordered  Testing/Procedures: Coronary CT will be scheduled after approved by insurance    Follow instructions  below  Follow-Up: At [facility], you and your health needs are our priority.  As part of our continuing mission to provide you with exceptional heart care, our providers are all part of one team.  This team includes your primary Cardiologist (physician) and Advanced Practice Providers or APPs (Physician Assistants and Nurse Practitioners) who all work together to provide you with the care you need, when you need it.  Your next appointment:  After Test    Provider:  PA     Your cardiac CT will be scheduled at one of the below locations:   [facility] [address] [place], [place] [address] [phone] (Severe contrast allergies only)  OR   [facility] [address] [place], [place] [address] [phone]  OR   [facility] [address] [place], [place] [address] [phone]  OR   [facility]IR[facility]. [facility] and Vascular Tower [address]  [place], [place] [address]  OR   [facility] [address] [place], [place] [phone]  If scheduled at [facility], please arrive at the [facility] and Children's Entrance (Entrance C2) of [facility] 30 minutes prior to test start time. You can use the FREE valet parking offered at entrance C (encouraged to control the heart rate for the test)  Proceed to the [facility] Radiology Department (first floor) to check-in and test prep.  All radiology patients and guests should use entrance C2 at [facility], accessed from [facility], even though the hospital's physical address listed is [address].  If scheduled at the Heart and Vascular Tower at [address], please enter the parking lot using the [address]  entrance and use the FREE valet service at the patient drop-off area. Enter the building and check-in with registration on the main floor.  If scheduled at [facility], please arrive to the Heart and Vascular Center 15 mins early for check-in and test prep.  There is spacious parking and easy access to the radiology department from the [facility] Heart and Vascular entrance. Please enter here and check-in with the desk attendant.   If scheduled at [facility], please arrive 30 minutes early for check-in and test prep.  Please follow these instructions carefully (unless otherwise directed):  An IV will be required for this test and Nitroglycerin will be given.  Hold all erectile dysfunction medications at least 3 days (72 hrs) prior to test. (Ie viagra, cialis, sildenafil, tadalafil, etc)   On the Night Before the Test: Be sure to Drink plenty of water. Do not consume any caffeinated/decaffeinated beverages or chocolate 12 hours prior to your test. Do not take any antihistamines 12 hours prior to your test.    On the Day of the Test: Drink plenty of water until 1 hour prior to the test. Do not eat any food 1 hour prior to test. You may take your regular medications prior to the test.  Take metoprolol  50 mg two hours prior to test.         After the Test: Drink plenty of water. After receiving IV contrast, you may experience a mild flushed feeling. This is normal. On occasion, you may experience a mild rash up to 24 hours after the test. This is not dangerous. If this occurs, you can take Benadryl 25 mg, Zyrtec, Claritin, or Allegra and increase your fluid intake. (Patients taking Tikosyn should avoid Benadryl, and  may take Zyrtec, Claritin, or Allegra) If you experience trouble breathing, this can be serious. If it is severe call 911 IMMEDIATELY. If it is mild, please call our office.  We will call to schedule your test 2-4 weeks out understanding that some  insurance companies will need an authorization prior to the service being performed.   For more information and frequently asked questions, please visit our website : [email or web address]  For non-scheduling related questions, please contact the cardiac imaging nurse navigator should you have any questions/concerns: Cardiac Imaging Nurse Navigators Direct Office Dial: [phone]   For scheduling needs, including cancellations and rescheduling, please call Brittany, [phone].  We recommend signing up for the patient portal called MyChart.  Sign up information is provided on this After Visit Summary.  MyChart is used to connect with patients for Virtual Visits (Telemedicine).  Patients are able to view lab/test results, encounter notes, upcoming appointments, etc.  Non-urgent messages can be sent to your provider as well.   To learn more about what you can do with MyChart, go to [email or web address].      Signed, Bindu Docter, MD  12/13/2023 12:18 PM    [facility]

## 2023-12-09 ENCOUNTER — Ambulatory Visit (INDEPENDENT_AMBULATORY_CARE_PROVIDER_SITE_OTHER): Admitting: Family Medicine

## 2023-12-09 ENCOUNTER — Encounter: Payer: Self-pay | Admitting: Family Medicine

## 2023-12-09 VITALS — BP 128/82 | HR 70 | Temp 98.1°F | Ht 66.54 in | Wt 193.7 lb

## 2023-12-09 DIAGNOSIS — Z0000 Encounter for general adult medical examination without abnormal findings: Secondary | ICD-10-CM | POA: Diagnosis not present

## 2023-12-09 DIAGNOSIS — Z8673 Personal history of transient ischemic attack (TIA), and cerebral infarction without residual deficits: Secondary | ICD-10-CM | POA: Insufficient documentation

## 2023-12-09 DIAGNOSIS — E785 Hyperlipidemia, unspecified: Secondary | ICD-10-CM | POA: Diagnosis not present

## 2023-12-09 NOTE — Progress Notes (Unsigned)
 Established Patient Office Visit  Subjective   Patient ID: Edwin Garcia, male    DOB: 1980-02-04  Age: 43 y.o. MRN: [id number]  Chief Complaint  Patient presents with   Annual Exam    HPI  {History (Optional):23778}  Edwin Garcia is seen today for physical exam.  He has history of hypertension, left bundle branch block, acute CVA last summer.  This presented as difficulty with word finding and left facial weakness.  Imaging revealed acute left midbrain CVA.  Carotid Doppler showed mild right ICA plaque without significant stenosis with no significant left ICA plaque or stenosis.  Cardiac event monitor showed rare PVCs and PACs but no atrial fibrillation.  Echocardiogram bubble study showed EF 40 to 45% with no major valve issues.  No interatrial shunt.  We had referred to cardiology and he does not see them till next week.  He denies any orthopnea, peripheral edema, or dyspnea.  Up until the time of his stroke have been drinking alcohol fairly regularly about every other day but rarely since then.  He had been on GLP-1 medication prior to stroke through another clinic and has not been taking that medication since then.  His only current medications are aspirin , Cardizem  CD 120 mg daily, and Lipitor 80 mg daily.  Generally feels well.  Poor diet compliance with increased sodium past couple days.  Blood pressures have generally been fairly well-controlled.  Denies any recent new neurologic symptoms.  Health maintenance reviewed:  Health Maintenance  Topic Date Due   Hepatitis B Vaccines 19-59 Average Risk (1 of 3 - 19+ 3-dose series) Never done   HPV VACCINES (1 - 3-dose SCDM series) Never done   COVID-19 Vaccine (3 - 2025-26 season) 09/19/2023   DTaP/Tdap/Td (4 - Td or Tdap) 12/07/2032   Influenza Vaccine  Completed   Hepatitis C Screening  Completed   HIV Screening  Completed   Pneumococcal Vaccine  Aged Out   Meningococcal B Vaccine  Aged Out   - He is fairly certain he had  previous hepatitis B vaccine.  Had flu vaccine a few weeks ago through employee health  Family history-significant for mother having hypertension and also hypertension and several grandparents.  Maternal grandmother with type 2 diabetes.  Maternal grandfather with history of diabetes.  No history of known premature CAD.  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  Non-smoker.  Currently only drinking about 1 alcoholic beverage per week but this is reduction from previous  Past Medical History:  Diagnosis Date   Allergy    APHTHOUS ULCERS 05/30/2009   Bulimia (HCC)    younger adult   CHALAZION, LEFT 05/30/2009   DEPRESSION 11/01/2008   Panic attack    Syphilis    TIA (transient ischemic attack)    Past Surgical History:  Procedure Laterality Date   ANKLE SURGERY  1999   RIGHT   TONSILLECTOMY     REMOVED AT 43 YRS OLD    reports that he has never smoked. He has never used smokeless tobacco. He reports current alcohol use. He reports that he does not use drugs. family history includes Cirrhosis in his paternal grandfather; Diabetes in his maternal grandmother; Diabetes (age of onset: 53) in his maternal grandfather; Healthy in his mother; Hyperlipidemia in his mother; Hypertension in his paternal grandmother; Hypertension (age of onset: 38) in his maternal grandmother; Hypertension (age of onset: 78) in his maternal grandfather; Other in his father. Allergies  Allergen Reactions   Cephalexin  Nausea Only    headache     Review of Systems  Constitutional:  Negative for chills, fever and malaise/fatigue.  Eyes:  Negative for blurred vision.  Respiratory:  Negative for cough and shortness of breath.   Cardiovascular:  Negative for chest pain, palpitations, orthopnea, leg swelling and PND.  Gastrointestinal:  Negative for abdominal pain.  Genitourinary:  Negative for dysuria.  Neurological:  Negative for dizziness, weakness  and headaches.  Psychiatric/Behavioral:  Negative for depression.       Objective:     BP 128/82 (BP Location: Left Arm, Cuff Size: Normal)   Pulse 70   Temp 98.1 F (36.7 C) (Oral)   Ht 5' 6.54 (1.69 m)   Wt 193 lb 11.2 oz (87.9 kg)   SpO2 98%   BMI 30.76 kg/m  BP Readings from Last 3 Encounters:  12/09/23 128/82  11/07/23 134/86  08/09/23 136/80   Wt Readings from Last 3 Encounters:  12/09/23 193 lb 11.2 oz (87.9 kg)  11/07/23 193 lb 3.2 oz (87.6 kg)  08/09/23 192 lb (87.1 kg)      Physical Exam Vitals reviewed.  Constitutional:      General: He is not in acute distress.    Appearance: He is well-developed. He is not ill-appearing or toxic-appearing.  HENT:     Head: Normocephalic and atraumatic.     Right Ear: External ear normal.     Left Ear: External ear normal.  Eyes:     Conjunctiva/sclera: Conjunctivae normal.     Pupils: Pupils are equal, round, and reactive to light.  Neck:     Thyroid : No thyromegaly.  Cardiovascular:     Rate and Rhythm: Normal rate and regular rhythm.     Heart sounds: Normal heart sounds. No murmur heard. Pulmonary:     Effort: No respiratory distress.     Breath sounds: No wheezing or rales.  Abdominal:     General: Bowel sounds are normal. There is no distension.     Palpations: Abdomen is soft. There is no mass.     Tenderness: There is no abdominal tenderness. There is no guarding or rebound.  Musculoskeletal:     Cervical back: Normal range of motion and neck supple.     Right lower leg: No edema.     Left lower leg: No edema.  Lymphadenopathy:     Cervical: No cervical adenopathy.  Skin:    Findings: No rash.  Neurological:     Mental Status: He is alert and oriented to person, place, and time.     Cranial Nerves: No cranial nerve deficit.      No results found for any visits on 12/09/23.  Last CBC Lab Results  Component Value Date   WBC 7.1 07/15/2023   HGB 14.9 07/15/2023   HCT 42.7 07/15/2023   MCV  84.4 07/15/2023   MCH 29.4 07/15/2023   RDW 13.8 07/15/2023   PLT 331 07/15/2023   Last metabolic panel Lab Results  Component Value Date   GLUCOSE 91 11/07/2023   NA 138 11/07/2023   K 3.9 11/07/2023   CL 103 11/07/2023   CO2 28 11/07/2023   BUN 9 11/07/2023   CREATININE 0.83 11/07/2023   GFR 109.10 11/07/2023   CALCIUM  9.3 11/07/2023   PROT 7.7 11/07/2023   ALBUMIN 4.5 11/07/2023   BILITOT 0.6 11/07/2023   ALKPHOS 108 11/07/2023   AST 27 11/07/2023   ALT 28 11/07/2023   ANIONGAP 14  07/15/2023   Last lipids Lab Results  Component Value Date   CHOL 119 11/07/2023   HDL 50.20 11/07/2023   LDLCALC 59 11/07/2023   TRIG 46.0 11/07/2023   CHOLHDL 2 11/07/2023   Last hemoglobin A1c Lab Results  Component Value Date   HGBA1C 5.6 11/07/2023   Last thyroid  functions Lab Results  Component Value Date   TSH 1.62 10/21/2021      The ASCVD Risk score (Arnett DK, et al., 2019) failed to calculate for the following reasons:   Risk score cannot be calculated because patient has a medical history suggesting prior/existing ASCVD    Assessment & Plan:   Edwin Garcia has medical problems as above including hypertension, left bundle branch block, history of CVA, hyperlipidemia, reduced ejection fraction by echocardiogram last summer.  No evidence for volume overload and he denies any peripheral edema or dyspnea at this time.  Pending follow-up with cardiology as above.  Blood pressure up initially today but came down some after rest.  We discussed several health maintenance issues as follows  -Reviewed recent labs.  A1c normal.  LDL cholesterol 59 with goal less than 70. - Continue aspirin  and high-dose statin - Immunizations up-to-date - We discussed recent echo after his stroke revealing decreased ejection fraction.  Etiology unclear.  Denies any recent chest pains.  No dyspnea.  Patient was consuming fair amount of alcohol prior to his stroke and we have strongly advocated he either  abstain from alcohol or continue to restrict alcohol greatly. - Patient aware he may need some revision of medications if further imaging shows continued reduced ejection fraction  Wolm Scarlet, MD

## 2023-12-13 ENCOUNTER — Ambulatory Visit: Attending: Cardiology | Admitting: Cardiology

## 2023-12-13 ENCOUNTER — Encounter: Payer: Self-pay | Admitting: Cardiology

## 2023-12-13 VITALS — BP 136/98 | HR 65 | Ht 66.0 in | Wt 196.8 lb

## 2023-12-13 DIAGNOSIS — I447 Left bundle-branch block, unspecified: Secondary | ICD-10-CM | POA: Diagnosis not present

## 2023-12-13 DIAGNOSIS — Z8673 Personal history of transient ischemic attack (TIA), and cerebral infarction without residual deficits: Secondary | ICD-10-CM | POA: Diagnosis not present

## 2023-12-13 DIAGNOSIS — I10 Essential (primary) hypertension: Secondary | ICD-10-CM | POA: Diagnosis not present

## 2023-12-13 DIAGNOSIS — I5022 Chronic systolic (congestive) heart failure: Secondary | ICD-10-CM

## 2023-12-13 DIAGNOSIS — I110 Hypertensive heart disease with heart failure: Secondary | ICD-10-CM

## 2023-12-13 MED ORDER — SACUBITRIL-VALSARTAN 24-26 MG PO TABS: 1.0000 | ORAL_TABLET | Freq: Two times a day (BID) | ORAL | 6 refills | Status: DC

## 2023-12-13 MED ORDER — METOPROLOL TARTRATE 50 MG PO TABS: ORAL_TABLET | ORAL | 0 refills | Status: DC

## 2023-12-13 NOTE — Patient Instructions (Addendum)
 Medication Instructions:  Stop Diltiazem   Start Entresto  2/26 mg twice a day Continue all other medications *If you need a refill on your cardiac medications before your next appointment, please call your pharmacy*  Lab Work: None ordered  Testing/Procedures: Coronary CT will be scheduled after approved by insurance    Follow instructions below  Follow-Up: At [facility], you and your health needs are our priority.  As part of our continuing mission to provide you with exceptional heart care, our providers are all part of one team.  This team includes your primary Cardiologist (physician) and Advanced Practice Providers or APPs (Physician Assistants and Nurse Practitioners) who all work together to provide you with the care you need, when you need it.  Your next appointment:  After Test    Provider:  PA     Your cardiac CT will be scheduled at one of the below locations:   [facility] [address] [place], [place] [address] [phone] (Severe contrast allergies only)  OR   [facility] [address] [place], [place] [address] [phone]  OR   [facility] [address] [place], [place] [address] [phone]  OR   [facility] and Vascular Tower [address]  [place], [place] [address]  OR   [facility] [address] [place], [place] [phone]  If scheduled at [facility], please arrive at the [facility] and Children's Entrance (Entrance C2) of [facility] 30 minutes prior to test start time. You can use the FREE valet parking offered at entrance C (encouraged to control the heart rate for the test)  Proceed to the [facility] Radiology Department (first floor) to check-in and test prep.  All radiology patients and guests should use entrance C2 at [facility], accessed from [facility], even though the hospital's physical address listed is  [address].  If scheduled at the Heart and Vascular Tower at [address], please enter the parking lot using the [address] entrance and use the FREE valet service at the patient drop-off area. Enter the building and check-in with registration on the main floor.  If scheduled at [facility], please arrive to the Heart and Vascular Center 15 mins early for check-in and test prep.  There is spacious parking and easy access to the radiology department from the [facility] Heart and Vascular entrance. Please enter here and check-in with the desk attendant.   If scheduled at [facility], please arrive 30 minutes early for check-in and test prep.  Please follow these instructions carefully (unless otherwise directed):  An IV will be required for this test and Nitroglycerin will be given.  Hold all erectile dysfunction medications at least 3 days (72 hrs) prior to test. (Ie viagra, cialis, sildenafil, tadalafil, etc)   On the Night Before the Test: Be sure to Drink plenty of water. Do not consume any caffeinated/decaffeinated beverages or chocolate 12 hours prior to your test. Do not take any antihistamines 12 hours prior to your test.    On the Day of the Test: Drink plenty of water until 1 hour prior to the test. Do not eat any food 1 hour prior to test. You may take your regular medications prior to the test.  Take metoprolol  50 mg two hours prior to test.         After the Test: Drink plenty of water. After receiving IV  contrast, you may experience a mild flushed feeling. This is normal. On occasion, you may experience a mild rash up to 24 hours after the test. This is not dangerous. If this occurs, you can take Benadryl 25 mg, Zyrtec, Claritin, or Allegra and increase your fluid intake. (Patients taking Tikosyn should avoid Benadryl, and may take Zyrtec, Claritin, or Allegra) If you experience trouble breathing, this can be serious. If it  is severe call 911 IMMEDIATELY. If it is mild, please call our office.  We will call to schedule your test 2-4 weeks out understanding that some insurance companies will need an authorization prior to the service being performed.   For more information and frequently asked questions, please visit our website : [email or web address]  For non-scheduling related questions, please contact the cardiac imaging nurse navigator should you have any questions/concerns: Cardiac Imaging Nurse Navigators Direct Office Dial: [phone]   For scheduling needs, including cancellations and rescheduling, please call Brittany, [phone].  We recommend signing up for the patient portal called MyChart.  Sign up information is provided on this After Visit Summary.  MyChart is used to connect with patients for Virtual Visits (Telemedicine).  Patients are able to view lab/test results, encounter notes, upcoming appointments, etc.  Non-urgent messages can be sent to your provider as well.   To learn more about what you can do with MyChart, go to [email or web address].

## 2023-12-14 ENCOUNTER — Other Ambulatory Visit: Payer: Self-pay | Admitting: Cardiology

## 2023-12-14 ENCOUNTER — Telehealth: Payer: Self-pay | Admitting: Pharmacy Technician

## 2023-12-14 NOTE — Telephone Encounter (Signed)
 Pharmacy Patient Advocate Encounter  Received notification from [employer] that Prior Authorization for Sacubitril -Valsartan  24-26MG   has been APPROVED from 12/14/23 to 12/13/24   PA #/Case ID/Reference #: [id number]

## 2023-12-14 NOTE — Telephone Encounter (Signed)
   Pharmacy Patient Advocate Encounter   Received notification from CoverMyMeds that prior authorization for sacubitril /valsartan  is required/requested.   Insurance verification completed.   The patient is insured through [facility].   Per test claim: PA required; PA submitted to above mentioned insurance via Latent Key/confirmation #/EOC [id number] Status is pending

## 2023-12-15 ENCOUNTER — Other Ambulatory Visit: Payer: Self-pay | Admitting: Cardiology

## 2023-12-16 ENCOUNTER — Other Ambulatory Visit: Payer: Self-pay | Admitting: Cardiology

## 2024-01-02 ENCOUNTER — Encounter (HOSPITAL_COMMUNITY): Payer: Self-pay

## 2024-01-04 ENCOUNTER — Ambulatory Visit (HOSPITAL_COMMUNITY): Admission: RE | Admit: 2024-01-04 | Discharge: 2024-01-04 | Attending: Cardiology | Admitting: Cardiology

## 2024-01-04 DIAGNOSIS — I10 Essential (primary) hypertension: Secondary | ICD-10-CM | POA: Insufficient documentation

## 2024-01-04 DIAGNOSIS — I5022 Chronic systolic (congestive) heart failure: Secondary | ICD-10-CM | POA: Insufficient documentation

## 2024-01-04 DIAGNOSIS — I447 Left bundle-branch block, unspecified: Secondary | ICD-10-CM | POA: Diagnosis present

## 2024-01-04 DIAGNOSIS — Z8673 Personal history of transient ischemic attack (TIA), and cerebral infarction without residual deficits: Secondary | ICD-10-CM | POA: Insufficient documentation

## 2024-01-04 MED ORDER — IOHEXOL 350 MG/ML SOLN
100.0000 mL | Freq: Once | INTRAVENOUS | Status: AC | PRN
  Administered 2024-01-04: 13:00:00 100 mL via INTRAVENOUS

## 2024-01-04 MED ORDER — NITROGLYCERIN 0.4 MG SL SUBL
0.8000 mg | SUBLINGUAL_TABLET | Freq: Once | SUBLINGUAL | Status: AC
  Administered 2024-01-04: 13:00:00 0.8 mg via SUBLINGUAL

## 2024-01-05 ENCOUNTER — Encounter: Payer: Self-pay | Admitting: Family Medicine

## 2024-01-06 ENCOUNTER — Ambulatory Visit: Payer: Self-pay | Admitting: Cardiology

## 2024-01-06 ENCOUNTER — Ambulatory Visit: Payer: Self-pay

## 2024-01-06 NOTE — Telephone Encounter (Signed)
 Spoke with the patient for more information.  Patient stated he is having the same symptoms from the visit in October, requested more antibiotics be called in and was advised a visit is needed for evaluation.  Appt scheduled on 12/22.

## 2024-01-06 NOTE — Telephone Encounter (Signed)
 See prior phone note-appt scheduled on 12/22.

## 2024-01-06 NOTE — Telephone Encounter (Signed)
 FYI Only or Action Required?: Action required by provider: request for appointment and clinical question for provider.  Patient was last seen in primary care on 12/09/2023 by Micheal Wolm ORN, MD.  Called Nurse Triage reporting Skin Problem.  Symptoms began yesterday.  Interventions attempted: Nothing.  Symptoms are: unchanged.  Triage Disposition: See Physician Within 24 Hours  Patient/caregiver understands and will follow disposition?: No, wishes to speak with PCP    Copied from CRM #[id number]. Topic: Clinical - Red Word Triage >> Jan 06, 2024  8:21 AM Edwin Garcia wrote: Red Word that prompted transfer to Nurse Triage:  Both lower legs have bumps on them with puss coming out. This has been going on since yesterday. This has happened before cleared up and returned.  He wrote this in MyChart: Hi,  I came for an office visit regarding my skin condition and I was prescribed a medication but now that I have taken all of the medication the condition has reappeared but on both legs instead of one. Can you please refer me to a dermatologist or do I need to come in so that I can get a different type of prescription Reason for Disposition  [1] Boil > 1/2 inch across (> 12 mm; larger than a marble) AND [2] center is soft or pus colored  Answer Assessment - Initial Assessment Questions No available appts today. Advised UC now and ED if symptoms worsen. Patient requesting antibiotic.  1. APPEARANCE of BOIL: What does the boil look like?      Pus; white centered, redness,  no drainage, not open 2. LOCATION: Where is the boil located?      Both lower legs 3. NUMBER: How many boils are there?      7 4. SIZE: How big is the boil? (e.g., inches, cm; compare to size of a coin or other object)     Pencil eraser, dime size 5. ONSET: When did the boil start?     September treated, restarted last night 6. PAIN: Is there any pain? If Yes, ask: How bad is the pain?   (Scale 1-10; or  mild, moderate, severe)     0 7. FEVER: Do you have a fever? If Yes, ask: What is it, how was it measured, and when did it start?      Denies fever chills n/v 8. SOURCE: Have you been around anyone with boils or other Staph infections? Have you ever had boils before?    Years ago 9. OTHER SYMPTOMS: Do you have any other symptoms? (e.g., shaking chills, weakness, rash elsewhere on body)     Denies rash, diff breathing  Protocols used: Boil (Skin Abscess)-A-AH

## 2024-01-09 ENCOUNTER — Encounter: Payer: Self-pay | Admitting: Emergency Medicine

## 2024-01-09 ENCOUNTER — Encounter: Payer: Self-pay | Admitting: Family Medicine

## 2024-01-09 ENCOUNTER — Ambulatory Visit: Attending: Emergency Medicine | Admitting: Emergency Medicine

## 2024-01-09 ENCOUNTER — Ambulatory Visit (INDEPENDENT_AMBULATORY_CARE_PROVIDER_SITE_OTHER): Admitting: Family Medicine

## 2024-01-09 VITALS — BP 108/70 | HR 90 | Temp 98.4°F | Wt 198.2 lb

## 2024-01-09 VITALS — BP 124/76 | HR 75 | Ht 66.0 in | Wt 195.0 lb

## 2024-01-09 DIAGNOSIS — I5022 Chronic systolic (congestive) heart failure: Secondary | ICD-10-CM

## 2024-01-09 DIAGNOSIS — H6122 Impacted cerumen, left ear: Secondary | ICD-10-CM | POA: Diagnosis not present

## 2024-01-09 DIAGNOSIS — E785 Hyperlipidemia, unspecified: Secondary | ICD-10-CM

## 2024-01-09 DIAGNOSIS — I447 Left bundle-branch block, unspecified: Secondary | ICD-10-CM | POA: Diagnosis not present

## 2024-01-09 DIAGNOSIS — I10 Essential (primary) hypertension: Secondary | ICD-10-CM

## 2024-01-09 DIAGNOSIS — Z8673 Personal history of transient ischemic attack (TIA), and cerebral infarction without residual deficits: Secondary | ICD-10-CM

## 2024-01-09 DIAGNOSIS — L080 Pyoderma: Secondary | ICD-10-CM

## 2024-01-09 MED ORDER — SACUBITRIL-VALSARTAN 24-26 MG PO TABS: 1.0000 | ORAL_TABLET | Freq: Two times a day (BID) | ORAL | 3 refills | Status: AC

## 2024-01-09 MED ORDER — METOPROLOL SUCCINATE ER 25 MG PO TB24: 25.0000 mg | ORAL_TABLET | Freq: Every day | ORAL | 3 refills | Status: DC

## 2024-01-09 MED ORDER — SACUBITRIL-VALSARTAN 49-51 MG PO TABS: 1.0000 | ORAL_TABLET | Freq: Two times a day (BID) | ORAL | 1 refills | Status: DC

## 2024-01-09 MED ORDER — DOXYCYCLINE HYCLATE 100 MG PO CAPS: 100.0000 mg | ORAL_CAPSULE | Freq: Two times a day (BID) | ORAL | 0 refills | Status: AC

## 2024-01-09 NOTE — Patient Instructions (Addendum)
 Medication Instructions:  Start metoprolol  succinate 25 mg daily CONTINUE TO TAKE ENTRESTO  24/26 TWICE DAILY.  Lab Work: BMET TO BE DONE TODAY.  Testing/Procedures: Your physician has requested that you have a cardiac MRI. Cardiac MRI uses a computer to create images of your heart as its beating, producing both still and moving pictures of your heart and major blood vessels. For further information please visit [email or web address]. Please follow the instruction sheet given to you today for more information.   Follow-Up: At [facility], you and your health needs are our priority.  As part of our continuing mission to provide you with exceptional heart care, our providers are all part of one team.  This team includes your primary Cardiologist (physician) and Advanced Practice Providers or APPs (Physician Assistants and Nurse Practitioners) who all work together to provide you with the care you need, when you need it.  Your next appointment:   6 WEEKS (SCHEDULED FOR 02-20-24 AT 10:55 AM.  Provider:   MADISON FOUNTAIN, NP

## 2024-01-09 NOTE — Progress Notes (Signed)
 " Cardiology Office Note:    Date:  01/09/2024  ID:  Edwin Garcia, DOB [birth year], MRN [id number] PCP: Micheal Wolm ORN, MD  [facility] Providers Cardiologist:  Peter Jordan, MD Cardiology APP:  Rana Lum CROME, NP       Patient Profile:       Chief Complaint: 1 month follow-up for heart failure History of Present Illness:  Edwin Garcia is a 43 y.o. male with visit-pertinent history of chronic systolic heart failure, hypertension, history of CVA, left bundle branch block  Patient established with cardiology service on 12/13/2023 with Dr. Jordan for evaluation of LV dysfunction noted on prior echocardiogram.  In July he had an echo for evaluation of CVA showing LVEF 40 to 45% with regional wall motion abnormalities.  He has had a chronic LBBB dating back to 2017.  He was currently asymptomatic on exam.  Suspected dyssynchrony from LBBB as a major contributor.  Diltiazem  was discontinued and he was started on Entresto  24-26 mg twice daily.  He underwent coronary CTA on 01/04/2024 showing coronary calcium  score 0 with no evidence of CAD.   Discussed the use of AI scribe software for clinical note transcription with the patient, who gave verbal consent to proceed.  History of Present Illness Edwin Garcia is a 43 year old male with chronic systolic heart failure who presents for follow-up and GDMT titration for his heart failure.  Today he is doing well without acute cardiovascular concerns or complaints.  No chest pains, dyspnea orthopnea, PND, LEE, or weight gain.  Remains active at his job without any limitations.  Has remained adherent to current medication regimen.  He had a prior stroke and takes aspirin  for secondary prevention and atorvastatin  for cholesterol management.   No syncope, presyncope, lightheadedness, dizziness, palpitations    Review of systems:  Please see the history of present illness. All other systems are reviewed and otherwise  negative.      Studies Reviewed:        Coronary CTA 01/04/2024 1. Coronary calcium  score of 0.   2. Normal coronary origins with left dominance.   3. CAD-RADS 0 No evidence of CAD.   4. Patent foramen ovale.   RECOMMENDATION: Consider non-atherosclerotic causes of chest pain.   Consider cardiac MRI to evaluate for possible noncompaction cardiomyopathy, clinical correlation is required.  Zio 07/27/2023 NSR with sinus brady (52/min) and Sinus tachy (160/min), ave 83/min. Symptoms associated with NSR and sinus tachy Rare PVC's and PAC's (<1%) No VT, SVT or atrial fib. No prolonged pauses. IVCD/BBB present Patch Wear Time:  13 days and 23 hours (2025-07-15T22:54:23-0400 to 2025-07-29T22:12:16-0400)  Echocardiogram 07/26/2023  1. Left ventricular ejection fraction, by estimation, is 40 to 45%. The  left ventricle has mildly decreased function. The left ventricle  demonstrates regional wall motion abnormalities (see scoring  diagram/findings for description). Left ventricular  diastolic parameters are consistent with Grade I diastolic dysfunction  (impaired relaxation). The average left ventricular global longitudinal  strain is -11.0 %. The global longitudinal strain is abnormal.   2. Right ventricular systolic function is normal. The right ventricular  size is normal. Tricuspid regurgitation signal is inadequate for assessing  PA pressure.   3. The mitral valve is normal in structure. Trivial mitral valve  regurgitation. No evidence of mitral stenosis.   4. The aortic valve was not well visualized. Aortic valve regurgitation  is not visualized. No aortic stenosis is present.   5. The inferior vena cava is normal in  size with greater than 50%  respiratory variability, suggesting right atrial pressure of 3 mmHg.   6. Agitated saline contrast bubble study was negative, with no evidence  of any interatrial shunt.    Risk Assessment/Calculations:              Physical  Exam:   VS:  BP 124/76 (BP Location: Right Arm, Patient Position: Sitting, Cuff Size: Normal)   Pulse 75   Ht 5' 6 (1.676 m)   Wt 195 lb (88.5 kg)   BMI 31.47 kg/m    Wt Readings from Last 3 Encounters:  01/09/24 198 lb 3.2 oz (89.9 kg)  01/09/24 195 lb (88.5 kg)  12/13/23 196 lb 12.8 oz (89.3 kg)    GEN: Well nourished, well developed in no acute distress NECK: No JVD; No carotid bruits CARDIAC: RRR, no murmurs, rubs, gallops RESPIRATORY:  Clear to auscultation without rales, wheezing or rhonchi  ABDOMEN: Soft, non-tender, non-distended EXTREMITIES:  No edema; No acute deformity      Assessment and Plan:  Chronic systolic CHF Suspected dyssynergy from LBBB Echo 07/2023 with LVEF 40 to 45% Coronary CTA 12/2023 with CAC of 0.  Cardiac MRI was recommended to evaluate for possible noncompaction cardiomyopathy - Today patient appears euvolemic and well compensated on exam.  NYHA class I.  Not requiring loop diuretic therapy - He is without dyspnea, orthopnea, PND, LEE, or weight gain - We discussed further GDMT titration today.  We discussed the 4 pillars of GDMT - Plan to start metoprolol  succinate 25 mg daily - Continue Entresto  24-26 mg twice daily - Can consider SGLT2i at follow-up visit if not cost prohibitive - BMET today - Plan for cardiac MRI for further evaluation  History of CVA - No new neurological symptoms - Continue aspirin  and atorvastatin  for secondary prevention  Hypertension Blood pressure today is well-controlled at 124/76 - Continue HF GDMT  Hyperlipidemia LDL 59 on 10/2023 and well-controlled - Continue atorvastatin  80 mg daily      Dispo:  Return in about 6 weeks (around 02/20/2024).  Signed, Lum LITTIE Louis, NP  "

## 2024-01-09 NOTE — Progress Notes (Signed)
 "  Established Patient Office Visit  Subjective   Patient ID: Edwin Garcia, male    DOB: 09/25/1980  Age: 43 y.o. MRN: [id number]  Chief Complaint  Patient presents with   Ear Pain    HPI   Edwin Garcia is seen with some fullness in the left ear for the past few days.  He thinks he may have some wax buildup.  Denies any major nasal congestion.  He is also seen with some recurrent rash lower legs bilaterally.  Was seen in October for small pustules consistent with follicular type rash.  We treated with doxycycline  then and this eventually cleared up.  He alternates between [employer] and [employer].  Does use oil based moisturizers frequently after bathing.  Denies any rash on his upper extremities or trunk.  Past Medical History:  Diagnosis Date   Allergy    APHTHOUS ULCERS 05/30/2009   Bulimia (HCC)    younger adult   CHALAZION, LEFT 05/30/2009   DEPRESSION 11/01/2008   Hypercholesterolemia    Hypertension    Panic attack    Stroke (cerebrum) (HCC)    Syphilis    TIA (transient ischemic attack)    Past Surgical History:  Procedure Laterality Date   ANKLE SURGERY  1999   RIGHT   TONSILLECTOMY     REMOVED AT 43 YRS OLD    reports that he has never smoked. He has never used smokeless tobacco. He reports current alcohol use. He reports that he does not use drugs. family history includes Cirrhosis in his paternal grandfather; Diabetes in his maternal grandmother; Diabetes (age of onset: 33) in his maternal grandfather; Hyperlipidemia in his mother; Hypertension in his mother and paternal grandmother; Hypertension (age of onset: 100) in his maternal grandmother; Hypertension (age of onset: 29) in his maternal grandfather; Other in his father. Allergies[1]  Review of Systems  Constitutional:  Negative for chills and fever.  HENT:  Negative for congestion, ear discharge and ear pain.   Skin:  Positive for rash.      Objective:     BP 108/70   Pulse 90   Temp 98.4 F (36.9 C)  (Oral)   Wt 198 lb 3.2 oz (89.9 kg)   SpO2 96%   BMI 31.99 kg/m  BP Readings from Last 3 Encounters:  01/09/24 108/70  01/09/24 124/76  01/04/24 134/84   Wt Readings from Last 3 Encounters:  01/09/24 198 lb 3.2 oz (89.9 kg)  01/09/24 195 lb (88.5 kg)  12/13/23 196 lb 12.8 oz (89.3 kg)      Physical Exam Vitals reviewed.  Constitutional:      General: He is not in acute distress.    Appearance: He is not ill-appearing.  HENT:     Ears:     Comments: Left ear canal reveals cerumen impaction deep in the canal near the eardrum.  This was irrigated out by CMA without difficulty and patient tolerated well. Right canal reveals only minimal cerumen Cardiovascular:     Rate and Rhythm: Normal rate and regular rhythm.  Skin:    Findings: Rash present.     Comments: Legs reveal some scattered small erythematous papules.  On magnification several of these have very small central pustule.  Neurological:     Mental Status: He is alert.      No results found for any visits on 01/09/24.    The ASCVD Risk score (Arnett DK, et al., 2019) failed to calculate for the following reasons:   Risk score  cannot be calculated because patient has a medical history suggesting prior/existing ASCVD   * - Cholesterol units were assumed    Assessment & Plan:   #1 cerumen impaction left canal removed by irrigation.  Patient tolerated well.  #2 pustular/follicular rash lower legs bilaterally.  Continue good antibacterial soap such as Dial.  Consider leaving off oil based moisturizers.  Repeat course of doxycycline  100 mg twice daily for 10 days.  Follow-up for any recurrent rash or other concerns.  Wolm Scarlet, MD     [1]  Allergies Allergen Reactions   Cephalexin  Nausea Only    headache   "

## 2024-01-10 ENCOUNTER — Ambulatory Visit: Payer: Self-pay | Admitting: Emergency Medicine

## 2024-01-10 LAB — BASIC METABOLIC PANEL WITH GFR
BUN/Creatinine Ratio: 8 — ABNORMAL LOW (ref 9–20)
BUN: 9 mg/dL (ref 6–24)
CO2: 24 mmol/L (ref 20–29)
Calcium: 9.8 mg/dL (ref 8.7–10.2)
Chloride: 102 mmol/L (ref 96–106)
Creatinine, Ser: 1.08 mg/dL (ref 0.76–1.27)
Glucose: 93 mg/dL (ref 70–99)
Potassium: 4.4 mmol/L (ref 3.5–5.2)
Sodium: 141 mmol/L (ref 134–144)
eGFR: 88 mL/min/1.73

## 2024-01-18 MED ORDER — DICLOXACILLIN SODIUM 500 MG PO CAPS: ORAL_CAPSULE | ORAL | 0 refills | Status: AC

## 2024-01-18 NOTE — Addendum Note (Signed)
 Addended by: METTA MATTOCKS L on: 01/18/2024 11:04 AM   Modules accepted: Orders

## 2024-01-24 ENCOUNTER — Encounter (HOSPITAL_COMMUNITY): Payer: Self-pay

## 2024-01-25 ENCOUNTER — Other Ambulatory Visit: Payer: Self-pay | Admitting: Emergency Medicine

## 2024-01-25 ENCOUNTER — Ambulatory Visit (HOSPITAL_COMMUNITY)
Admission: RE | Admit: 2024-01-25 | Discharge: 2024-01-25 | Disposition: A | Discharge status: Home / Self Care | Source: Ambulatory Visit | Attending: Emergency Medicine | Admitting: Emergency Medicine

## 2024-01-25 DIAGNOSIS — I447 Left bundle-branch block, unspecified: Secondary | ICD-10-CM | POA: Insufficient documentation

## 2024-01-25 DIAGNOSIS — I5022 Chronic systolic (congestive) heart failure: Secondary | ICD-10-CM

## 2024-01-25 MED ORDER — GADOBUTROL 1 MMOL/ML IV SOLN
10.0000 mL | Freq: Once | INTRAVENOUS | Status: AC | PRN
  Administered 2024-01-25: 10 mL via INTRAVENOUS

## 2024-02-02 ENCOUNTER — Telehealth (HOSPITAL_BASED_OUTPATIENT_CLINIC_OR_DEPARTMENT_OTHER): Payer: Self-pay | Admitting: *Deleted

## 2024-02-02 NOTE — Telephone Encounter (Signed)
"  ° °  Pre-operative Risk Assessment    Patient Name: Edwin Garcia  DOB: September 19, 1980 MRN: [id number]   Date of last office visit: 01/09/24 MADISON FOUNTAIIN, NP Date of next office visit: 02/20/24 MADISON FOUNTAIN, NP   Request for Surgical Clearance    Procedure:  ROOT CANAL  Date of Surgery:  Clearance 02/13/24                                Surgeon:  DR. TENA SORROW, DDS Surgeon's Group or Practice Name:  [facility] Phone number:  [phone] Fax number:  [phone]   Type of Clearance Requested:   - Medical  - Pharmacy:  Hold Aspirin      Type of Anesthesia:  Local    Additional requests/questions:    Bonney Niels Jest   02/02/2024, 8:51 AM   "

## 2024-02-02 NOTE — Telephone Encounter (Signed)
"  ° °  Patient Name: Edwin Garcia  DOB: March 24, 1980 MRN: [id number]  Primary Cardiologist: Peter Jordan, MD  Chart reviewed as part of pre-operative protocol coverage. Given past medical history and time since last visit, based on ACC/AHA guidelines, DEERIC CRUISE is at acceptable risk for the planned procedure without further cardiovascular testing.   Last seen in clinic on 01/09/2024.  He was euvolemic and well compensated without signs or symptoms of angina.  Cardiac MRI on 01/2024 showed stable LVEF.  Aspirin  is prescribed by neurology for history of CVA.  I will route this recommendation to the requesting party via Epic fax function and remove from pre-op pool.  Please call with questions.  Lum LITTIE Louis, NP 02/02/2024, 11:21 AM  "

## 2024-02-17 ENCOUNTER — Encounter: Payer: Self-pay | Admitting: *Deleted

## 2024-02-20 ENCOUNTER — Encounter: Payer: Self-pay | Admitting: Emergency Medicine

## 2024-02-20 ENCOUNTER — Ambulatory Visit: Admitting: Emergency Medicine

## 2024-02-20 VITALS — BP 130/70 | HR 64 | Ht 66.0 in | Wt 197.0 lb

## 2024-02-20 DIAGNOSIS — Z8673 Personal history of transient ischemic attack (TIA), and cerebral infarction without residual deficits: Secondary | ICD-10-CM

## 2024-02-20 DIAGNOSIS — E782 Mixed hyperlipidemia: Secondary | ICD-10-CM | POA: Diagnosis not present

## 2024-02-20 DIAGNOSIS — I10 Essential (primary) hypertension: Secondary | ICD-10-CM | POA: Diagnosis not present

## 2024-02-20 DIAGNOSIS — I5022 Chronic systolic (congestive) heart failure: Secondary | ICD-10-CM | POA: Diagnosis not present

## 2024-02-20 DIAGNOSIS — Z79899 Other long term (current) drug therapy: Secondary | ICD-10-CM | POA: Diagnosis not present

## 2024-02-20 DIAGNOSIS — I447 Left bundle-branch block, unspecified: Secondary | ICD-10-CM

## 2024-02-20 MED ORDER — EMPAGLIFLOZIN 10 MG PO TABS: 10.0000 mg | ORAL_TABLET | Freq: Every day | ORAL | 3 refills | Status: AC

## 2024-02-20 MED ORDER — METOPROLOL SUCCINATE ER 25 MG PO TB24: 12.5000 mg | ORAL_TABLET | Freq: Every day | ORAL | 3 refills | Status: AC

## 2024-05-28 ENCOUNTER — Ambulatory Visit: Admitting: Cardiology
